# Patient Record
Sex: Male | Born: 1970 | Hispanic: No | Marital: Married | Smoking: Never smoker
Health system: Southern US, Community
[De-identification: ages and names within clinical notes are randomized; demographics above are authoritative.]

## PROBLEM LIST (undated history)

## (undated) DIAGNOSIS — K754 Autoimmune hepatitis: Secondary | ICD-10-CM

## (undated) DIAGNOSIS — E559 Vitamin D deficiency, unspecified: Secondary | ICD-10-CM

## (undated) DIAGNOSIS — K750 Abscess of liver: Secondary | ICD-10-CM

## (undated) HISTORY — DX: Vitamin D deficiency, unspecified: E55.9

## (undated) HISTORY — PX: OTHER SURGICAL HISTORY: SHX169

## (undated) HISTORY — PX: PERCUTANEOUS LIVER BIOPSY: SUR136

## (undated) HISTORY — DX: Abscess of liver: K75.0

## (undated) HISTORY — DX: Autoimmune hepatitis: K75.4

---

## 2000-12-06 ENCOUNTER — Encounter (HOSPITAL_BASED_OUTPATIENT_CLINIC_OR_DEPARTMENT_OTHER): Payer: Self-pay | Admitting: General Surgery

## 2000-12-06 HISTORY — PX: HEMORRHOID SURGERY: SHX153

## 2000-12-06 HISTORY — PX: PROCTOSCOPY: SHX2266

## 2000-12-07 ENCOUNTER — Inpatient Hospital Stay (HOSPITAL_COMMUNITY): Admission: RE | Admit: 2000-12-07 | Discharge: 2000-12-09 | Payer: Self-pay | Admitting: General Surgery

## 2000-12-08 ENCOUNTER — Encounter: Payer: Self-pay | Admitting: Urology

## 2000-12-28 ENCOUNTER — Ambulatory Visit (HOSPITAL_COMMUNITY): Admission: RE | Admit: 2000-12-28 | Discharge: 2000-12-28 | Payer: Self-pay | Admitting: Family Medicine

## 2000-12-28 ENCOUNTER — Encounter: Payer: Self-pay | Admitting: Family Medicine

## 2004-10-06 ENCOUNTER — Emergency Department (HOSPITAL_COMMUNITY): Admission: EM | Admit: 2004-10-06 | Discharge: 2004-10-07 | Payer: Self-pay | Admitting: Emergency Medicine

## 2004-10-14 ENCOUNTER — Emergency Department (HOSPITAL_COMMUNITY): Admission: EM | Admit: 2004-10-14 | Discharge: 2004-10-14 | Payer: Self-pay | Admitting: *Deleted

## 2010-12-08 ENCOUNTER — Emergency Department (INDEPENDENT_AMBULATORY_CARE_PROVIDER_SITE_OTHER): Payer: Worker's Compensation

## 2010-12-08 ENCOUNTER — Emergency Department (HOSPITAL_BASED_OUTPATIENT_CLINIC_OR_DEPARTMENT_OTHER)
Admission: EM | Admit: 2010-12-08 | Discharge: 2010-12-08 | Disposition: A | Discharge status: Home / Self Care | Payer: Worker's Compensation | Attending: Emergency Medicine | Admitting: Emergency Medicine

## 2010-12-08 DIAGNOSIS — R51 Headache: Secondary | ICD-10-CM

## 2010-12-08 DIAGNOSIS — M25569 Pain in unspecified knee: Secondary | ICD-10-CM | POA: Insufficient documentation

## 2010-12-08 DIAGNOSIS — W19XXXA Unspecified fall, initial encounter: Secondary | ICD-10-CM

## 2010-12-08 DIAGNOSIS — Y9289 Other specified places as the place of occurrence of the external cause: Secondary | ICD-10-CM | POA: Insufficient documentation

## 2010-12-08 DIAGNOSIS — E0789 Other specified disorders of thyroid: Secondary | ICD-10-CM

## 2010-12-08 DIAGNOSIS — S0990XA Unspecified injury of head, initial encounter: Secondary | ICD-10-CM

## 2010-12-08 DIAGNOSIS — R404 Transient alteration of awareness: Secondary | ICD-10-CM

## 2010-12-08 DIAGNOSIS — S8990XA Unspecified injury of unspecified lower leg, initial encounter: Secondary | ICD-10-CM

## 2010-12-08 DIAGNOSIS — S060XAA Concussion with loss of consciousness status unknown, initial encounter: Secondary | ICD-10-CM | POA: Insufficient documentation

## 2010-12-08 DIAGNOSIS — S060X9A Concussion with loss of consciousness of unspecified duration, initial encounter: Secondary | ICD-10-CM

## 2010-12-08 MED ORDER — IBUPROFEN 400 MG PO TABS: 400.0000 mg | ORAL_TABLET | Freq: Four times a day (QID) | ORAL | Status: AC | PRN

## 2010-12-08 NOTE — ED Notes (Signed)
Pt speaks very little [place] and interpreter phones were used to get all information from patient; Dr Ignacia Palma in room at time of arrival

## 2010-12-08 NOTE — ED Notes (Signed)
Patient is resting comfortably. 

## 2010-12-08 NOTE — ED Notes (Signed)
c-spine films negative; c-collar removed per order Dr Ignacia Palma; po fluids to patient; comfortable;

## 2010-12-08 NOTE — ED Notes (Signed)
Fell at [employer] per co-worker

## 2010-12-09 ENCOUNTER — Encounter (HOSPITAL_BASED_OUTPATIENT_CLINIC_OR_DEPARTMENT_OTHER): Payer: Self-pay | Admitting: Emergency Medicine

## 2010-12-09 NOTE — ED Provider Notes (Signed)
History     CSN: [id number] Arrival date & time: 12/08/2010  3:14 PM   First MD Initiated Contact with Patient 12/08/10 1536      Chief Complaint  Patient presents with  . Fall  . Loss of Consciousness    (Consider location/radiation/quality/duration/timing/severity/associated sxs/prior treatment) HPI Comments: Pt's coworker says that pt fell at work, hitting his head.  He was unconscious for three minutes.  There were no convulsive movements, and no incontinence.  He complains of pain in the left jaw and left knee.  A cervical collar was applied in Triage.  Review of systems could not be obtained due to the language barrier.  Patient is a 40 y.o. male presenting with head injury. The history is provided by the patient and a friend. The history is limited by a language barrier. A language interpreter was used Hospital doctor).  Head Injury  The incident occurred less than 1 hour ago. He came to the ER via walk-in. The injury mechanism was a fall. He lost consciousness for a period of 1 to 5 minutes. There was no blood loss. The quality of the pain is described as dull. The patient is experiencing no pain. He has tried nothing for the symptoms.    Past Medical History  Diagnosis Date  . Rectal pain     History reviewed. No pertinent past surgical history.  No family history on file.  History  Substance Use Topics  . Smoking status: Never Smoker   . Smokeless tobacco: Not on file  . Alcohol Use: 1.2 oz/week    2 Cans of beer per week     once a week      Review of Systems  Unable to perform ROS: Other    Allergies  Review of patient's allergies indicates no known allergies.  Home Medications   Current Outpatient Rx  Name Route Sig Dispense Refill  . IBUPROFEN 400 MG PO TABS Oral Take 1 tablet (400 mg total) by mouth every 6 (six) hours as needed for pain. 20 tablet 0    BP 126/87  Pulse 71  Temp(Src) 98.7 F (37.1 C) (Oral)  Resp 18  SpO2  100%  Physical Exam  Constitutional: He is oriented to person, place, and time. He appears well-developed and well-nourished.       In mild distress, wearing cervical collar, complaining of pain in the left jaw and left knee.  HENT:  Head: Normocephalic and atraumatic.  Right Ear: External ear normal.  Left Ear: External ear normal.  Mouth/Throat: Oropharynx is clear and moist.  Eyes: Conjunctivae and EOM are normal. Pupils are equal, round, and reactive to light.  Neck: Normal range of motion. Neck supple.       C-collar left on until CT of C-spine completed.  Cardiovascular: Normal rate, regular rhythm and normal heart sounds.   Pulmonary/Chest: Effort normal and breath sounds normal.  Abdominal: Bowel sounds are normal. He exhibits no distension. There is no tenderness.  Musculoskeletal:       Left knee has mild tenderness over the patella.  There is no bony deformity and no ligamentous instability.  There is no effusion.  He has intact pulses, sensation and tendon function in the left foot.   Neurological: He is alert and oriented to person, place, and time.       No sensory of motor deficits.  Skin: Skin is warm and dry.  Psychiatric: He has a normal mood and affect. His behavior is normal.  ED Course  Procedures (including critical care time)  Labs Reviewed - No data to display Ct Head Wo Contrast  12/08/2010  *RADIOLOGY REPORT*  Clinical Data:  Fall.  Loss of consciousness.  Head injury.  Facial pain.  CT HEAD WITHOUT CONTRAST CT MAXILLOFACIAL WITHOUT CONTRAST CT CERVICAL SPINE WITHOUT CONTRAST  Technique:  Multidetector CT imaging of the head, cervical spine, and maxillofacial structures were performed using the standard protocol without intravenous contrast. Multiplanar CT image reconstructions of the cervical spine and maxillofacial structures were also generated.  Comparison:  None.  CT HEAD  Findings: Calcification of the globus pallidus nuclei is symmetric and likely  physiologic.  The brain stem, cerebellum, cerebral peduncles, thalami, basal ganglia, basilar cisterns, and ventricular system appear unremarkable.  No intracranial hemorrhage, mass lesion, or acute infarction is identified.  IMPRESSION:  1.  No significant abnormality identified.  CT MAXILLOFACIAL  Findings:  Dental implants noted.  The maxillary incisors and the canine superior anteriorly displaced with respect to the palate, with mesh in place, this probably represents expected appearance of correction but correlate with physical exam of the teeth.  There appears to be a small cavity of the medial right mandibular molar on image 58 of series 4.  A supernumerary tooth is present inferiorly in the right mandible as shown on images 72-73 of series 4.  No facial fracture is observed.  The upper portion of the mandibular body has a thin anterior - posterior dimension as shown on image 67 of series 4.  The orbits appear unremarkable.  IMPRESSION:  1.  No facial fracture is observed. 2.  Dental prosthesis noted, with unusually thin upper mandibular body and with a supernumerary tooth in the lower portion of the right mandible.  The maxillary incisors and can lines are anteriorly located with respect to the palate, but this is likely due to a bridge - correlate with inspection of the oral cavity. 3.  A small cavity of the right medial mandibular molar adjacent to the premolar.  CT CERVICAL SPINE  Findings:   No prevertebral soft tissue swelling is identified.  No cervical vertebral malalignment noted.  No cervical spine fracture is evident.  A 0.9 x 0.7 cm lucent lesion with a thin sclerotic rim is noted in the pars region of the left C2 vertebra.  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  IMPRESSION:  1.  No acute cervical spine findings. 2.  Lucent lesion in the pars region of the left C2 vertebra has a  thin sclerotic margin indicating a longstanding process, most compatible with a benign bony lesion such as a bone cyst.  Given the small size, osteoblastoma is considered unlikely.  A small chondroid lesion could appear similarly. 3.  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  Original Report Authenticated By: Dellia Cloud, M.D.   Ct Cervical Spine Wo Contrast  12/08/2010  *RADIOLOGY REPORT*  Clinical Data:  Fall.  Loss of consciousness.  Head injury.  Facial pain.  CT HEAD WITHOUT CONTRAST CT MAXILLOFACIAL WITHOUT CONTRAST CT CERVICAL SPINE WITHOUT CONTRAST  Technique:  Multidetector CT imaging of the head, cervical spine, and maxillofacial structures were performed using the standard protocol without intravenous contrast. Multiplanar CT image reconstructions of the cervical spine and maxillofacial structures were also generated.  Comparison:  None.  CT HEAD  Findings: Calcification of the globus pallidus nuclei is symmetric and likely physiologic.  The brain stem, cerebellum, cerebral peduncles, thalami, basal ganglia, basilar cisterns, and ventricular system appear unremarkable.  No intracranial hemorrhage, mass lesion, or acute infarction is identified.  IMPRESSION:  1.  No significant abnormality identified.  CT MAXILLOFACIAL  Findings:  Dental implants noted.  The maxillary incisors and the canine superior anteriorly displaced with respect to the palate, with mesh in place, this probably represents expected appearance of correction but correlate with physical exam of the teeth.  There appears to be a small cavity of the medial right mandibular molar on image 58 of series 4.  A supernumerary tooth is present inferiorly in the right mandible as shown on images 72-73 of series 4.  No facial fracture is observed.  The upper portion of the mandibular body has a thin anterior - posterior dimension as shown  on image 67 of series 4.  The orbits appear unremarkable.  IMPRESSION:  1.  No facial fracture is observed. 2.  Dental prosthesis noted, with unusually thin upper mandibular body and with a supernumerary tooth in the lower portion of the right mandible.  The maxillary incisors and can lines are anteriorly located with respect to the palate, but this is likely due to a bridge - correlate with inspection of the oral cavity. 3.  A small cavity of the right medial mandibular molar adjacent to the premolar.  CT CERVICAL SPINE  Findings:   No prevertebral soft tissue swelling is identified.  No cervical vertebral malalignment noted.  No cervical spine fracture is evident.  A 0.9 x 0.7 cm lucent lesion with a thin sclerotic rim is noted in the pars region of the left C2 vertebra.  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  IMPRESSION:  1.  No acute cervical spine findings. 2.  Lucent lesion in the pars region of the left C2 vertebra has a thin sclerotic margin indicating a longstanding process, most compatible with a benign bony lesion such as a bone cyst.  Given the small size, osteoblastoma is considered unlikely.  A small chondroid lesion could appear similarly. 3.  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  Original Report Authenticated By: Dellia Cloud, M.D.   Dg Knee Complete 4 Views Left  12/08/2010  *RADIOLOGY REPORT*  Clinical Data: Larey Seat.  Injured left knee.  LEFT KNEE - COMPLETE 4+ VIEW  Comparison: None  Findings: The joint spaces are maintained.  No acute bony findings or joint effusion.  IMPRESSION: No acute bony findings.  Original Report Authenticated By: P. Loralie Champagne, M.D.   Ct Maxillofacial Wo Cm  12/08/2010  *RADIOLOGY REPORT*  Clinical Data:  Fall.  Loss of consciousness.   Head injury.  Facial pain.  CT HEAD WITHOUT CONTRAST CT MAXILLOFACIAL WITHOUT CONTRAST CT CERVICAL SPINE WITHOUT CONTRAST  Technique:  Multidetector CT imaging of the head, cervical spine, and maxillofacial structures were performed using the standard protocol without intravenous contrast. Multiplanar CT image reconstructions of the cervical spine and maxillofacial structures were also generated.  Comparison:  None.  CT HEAD  Findings: Calcification of the globus pallidus nuclei is symmetric and likely physiologic.  The brain stem, cerebellum, cerebral peduncles, thalami, basal ganglia, basilar cisterns, and ventricular system appear unremarkable.  No intracranial hemorrhage, mass lesion, or acute infarction  is identified.  IMPRESSION:  1.  No significant abnormality identified.  CT MAXILLOFACIAL  Findings:  Dental implants noted.  The maxillary incisors and the canine superior anteriorly displaced with respect to the palate, with mesh in place, this probably represents expected appearance of correction but correlate with physical exam of the teeth.  There appears to be a small cavity of the medial right mandibular molar on image 58 of series 4.  A supernumerary tooth is present inferiorly in the right mandible as shown on images 72-73 of series 4.  No facial fracture is observed.  The upper portion of the mandibular body has a thin anterior - posterior dimension as shown on image 67 of series 4.  The orbits appear unremarkable.  IMPRESSION:  1.  No facial fracture is observed. 2.  Dental prosthesis noted, with unusually thin upper mandibular body and with a supernumerary tooth in the lower portion of the right mandible.  The maxillary incisors and can lines are anteriorly located with respect to the palate, but this is likely due to a bridge - correlate with inspection of the oral cavity. 3.  A small cavity of the right medial mandibular molar adjacent to the premolar.  CT CERVICAL SPINE  Findings:   No  prevertebral soft tissue swelling is identified.  No cervical vertebral malalignment noted.  No cervical spine fracture is evident.  A 0.9 x 0.7 cm lucent lesion with a thin sclerotic rim is noted in the pars region of the left C2 vertebra.  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  IMPRESSION:  1.  No acute cervical spine findings. 2.  Lucent lesion in the pars region of the left C2 vertebra has a thin sclerotic margin indicating a longstanding process, most compatible with a benign bony lesion such as a bone cyst.  Given the small size, osteoblastoma is considered unlikely.  A small chondroid lesion could appear similarly. 3.  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  Original Report Authenticated By: Dellia Cloud, M.D.   Exam and X-rays were negative.  Pt reassured.  Rx Ibuprofen if needed for pain.  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  1. Cerebral concussion            Carleene Cooper III, MD 12/09/10 [phone]

## 2011-02-13 ENCOUNTER — Ambulatory Visit (INDEPENDENT_AMBULATORY_CARE_PROVIDER_SITE_OTHER): Payer: 59

## 2011-02-13 DIAGNOSIS — J111 Influenza due to unidentified influenza virus with other respiratory manifestations: Secondary | ICD-10-CM

## 2011-02-13 DIAGNOSIS — J189 Pneumonia, unspecified organism: Secondary | ICD-10-CM

## 2011-08-14 ENCOUNTER — Emergency Department (HOSPITAL_COMMUNITY)
Admission: EM | Admit: 2011-08-14 | Discharge: 2011-08-14 | Disposition: A | Discharge status: Home / Self Care | Payer: 59 | Attending: Emergency Medicine | Admitting: Emergency Medicine

## 2011-08-14 ENCOUNTER — Encounter (HOSPITAL_COMMUNITY): Payer: Self-pay | Admitting: Emergency Medicine

## 2011-08-14 DIAGNOSIS — B349 Viral infection, unspecified: Secondary | ICD-10-CM

## 2011-08-14 DIAGNOSIS — J029 Acute pharyngitis, unspecified: Secondary | ICD-10-CM | POA: Insufficient documentation

## 2011-08-14 DIAGNOSIS — B9789 Other viral agents as the cause of diseases classified elsewhere: Secondary | ICD-10-CM | POA: Insufficient documentation

## 2011-08-14 DIAGNOSIS — J310 Chronic rhinitis: Secondary | ICD-10-CM | POA: Insufficient documentation

## 2011-08-14 DIAGNOSIS — R300 Dysuria: Secondary | ICD-10-CM | POA: Insufficient documentation

## 2011-08-14 LAB — URINALYSIS, ROUTINE W REFLEX MICROSCOPIC
Leukocytes, UA: NEGATIVE
Nitrite: NEGATIVE
Specific Gravity, Urine: 1.015 (ref 1.005–1.030)
Urobilinogen, UA: 1 mg/dL (ref 0.0–1.0)
pH: 8 (ref 5.0–8.0)

## 2011-08-14 LAB — RAPID STREP SCREEN (MED CTR MEBANE ONLY): Streptococcus, Group A Screen (Direct): NEGATIVE

## 2011-08-14 NOTE — ED Provider Notes (Signed)
History     CSN: [id number]  Arrival date & time 08/14/11  2036   First MD Initiated Contact with Patient 08/14/11 2223      Chief Complaint  Patient presents with  . Sore Throat  . Urinary Urgency    (Consider location/radiation/quality/duration/timing/severity/associated sxs/prior treatment) HPI Comments: Patient with 5 days of low grade fever, rhinitis, sore throat and dysuria Has been takingOTC Advil with transient relief   Patient is a 41 y.o. male presenting with pharyngitis. The history is provided by the patient. A language interpreter was used.  Sore Throat The current episode started in the past 7 days. The problem has been unchanged. Associated symptoms include congestion, a fever, a sore throat and urinary symptoms. Pertinent negatives include no chills, headaches, vomiting or weakness.    Past Medical History  Diagnosis Date  . Rectal pain     Past Surgical History  Procedure Date  . Colonoscopy     History reviewed. No pertinent family history.  History  Substance Use Topics  . Smoking status: Never Smoker   . Smokeless tobacco: Not on file  . Alcohol Use: 1.2 oz/week    2 Cans of beer per week     once a week      Review of Systems  Constitutional: Positive for fever. Negative for chills.  HENT: Positive for congestion, sore throat, rhinorrhea and postnasal drip. Negative for trouble swallowing, voice change and sinus pressure.   Eyes: Negative for visual disturbance.  Gastrointestinal: Negative for vomiting.  Genitourinary: Positive for frequency.  Neurological: Negative for dizziness, weakness and headaches.    Allergies  Review of patient's allergies indicates no known allergies.  Home Medications   Current Outpatient Rx  Name Route Sig Dispense Refill  . IBUPROFEN 200 MG PO TABS Oral Take 200 mg by mouth every 6 (six) hours as needed. For pain      BP 123/96  Pulse 69  Temp 98.9 F (37.2 C) (Oral)  Resp 22  SpO2 100%  Physical  Exam  Constitutional: He appears well-developed.  HENT:  Head: Normocephalic.  Eyes: Pupils are equal, round, and reactive to light.  Neck: Normal range of motion.  Cardiovascular: Normal rate.   Pulmonary/Chest: Effort normal. No respiratory distress. He has no wheezes.  Abdominal: Soft. He exhibits no distension.  Genitourinary: Penis normal.  Musculoskeletal: Normal range of motion.  Neurological: He is alert.  Skin: Skin is warm.    ED Course  Procedures (including critical care time)  Labs Reviewed  URINALYSIS, ROUTINE W REFLEX MICROSCOPIC - Abnormal; Notable for the following:    Color, Urine AMBER (*)  BIOCHEMICALS MAY BE AFFECTED BY COLOR   APPearance CLOUDY (*)     Bilirubin Urine SMALL (*)     All other components within normal limits  RAPID STREP SCREEN   No results found.   1. Viral syndrome       MDM   Strep test negative exam is non contributory  UA is normal         Arman Filter, NP 08/14/11 2228  Arman Filter, NP 08/14/11 2228  Arman Filter, NP 08/14/11 2229

## 2011-08-14 NOTE — ED Provider Notes (Signed)
Medical screening examination/treatment/procedure(s) were performed by non-physician practitioner and as supervising physician I was immediately available for consultation/collaboration.   Charles B. Sheldon, MD 08/14/11 2333 

## 2011-08-14 NOTE — ED Notes (Addendum)
Pt speaks [place]- interpreter present at triage; Pt reports fever, cold, sore throat, reports SOB, for 5 days; also reports difficulty urine, but no pain, has been able to void today, but little amounts; also states having nausea; on inspection, pt does have swelling to back of throat

## 2011-08-17 ENCOUNTER — Ambulatory Visit
Admission: RE | Admit: 2011-08-17 | Discharge: 2011-08-17 | Disposition: A | Discharge status: Home / Self Care | Payer: 59 | Source: Ambulatory Visit | Attending: Family Medicine | Admitting: Family Medicine

## 2011-08-17 ENCOUNTER — Ambulatory Visit (INDEPENDENT_AMBULATORY_CARE_PROVIDER_SITE_OTHER): Payer: 59 | Admitting: Family Medicine

## 2011-08-17 VITALS — BP 117/76 | HR 69 | Temp 98.0°F | Resp 16 | Ht 68.25 in | Wt 160.2 lb

## 2011-08-17 DIAGNOSIS — R109 Unspecified abdominal pain: Secondary | ICD-10-CM

## 2011-08-17 DIAGNOSIS — R17 Unspecified jaundice: Secondary | ICD-10-CM

## 2011-08-17 DIAGNOSIS — R630 Anorexia: Secondary | ICD-10-CM

## 2011-08-17 LAB — COMPREHENSIVE METABOLIC PANEL
BUN: 12 mg/dL (ref 6–23)
CO2: 28 mEq/L (ref 19–32)
Creat: 0.72 mg/dL (ref 0.50–1.35)
Glucose, Bld: 92 mg/dL (ref 70–99)
Sodium: 136 mEq/L (ref 135–145)
Total Bilirubin: 10.1 mg/dL — ABNORMAL HIGH (ref 0.3–1.2)
Total Protein: 8.2 g/dL (ref 6.0–8.3)

## 2011-08-17 LAB — POCT CBC
HCT, POC: 42.3 % — AB (ref 43.5–53.7)
Hemoglobin: 13.6 g/dL — AB (ref 14.1–18.1)
Lymph, poc: 2.4 (ref 0.6–3.4)
MCH, POC: 29.6 pg (ref 27–31.2)
MCHC: 32.2 g/dL (ref 31.8–35.4)
MCV: 91.9 fL (ref 80–97)
WBC: 5.5 10*3/uL (ref 4.6–10.2)

## 2011-08-17 LAB — LIPASE: Lipase: 28 U/L (ref 0–75)

## 2011-08-17 LAB — PROTIME-INR: INR: 1.16 (ref <1.50)

## 2011-08-17 LAB — IFOBT (OCCULT BLOOD): IFOBT: NEGATIVE

## 2011-08-17 LAB — AMYLASE: Amylase: 60 U/L (ref 0–105)

## 2011-08-17 NOTE — Patient Instructions (Addendum)
I am concerned that you are having a problem with your liver or galbaldder.  We are going to have an ultrasound done today at 11:00 am. Do not eat or drink anything until after your ultrasound.   Your ultrasound will be done at [facility].   I have also ordered labs today which will be back in a few hours.   I will call you when your labs and ultrasound results are in- will be later today

## 2011-08-17 NOTE — Progress Notes (Signed)
Date:  08/17/2011   Name:  Michael West   DOB:  1970/10/16   MRN:  [id number]  PCP:  No primary provider on file.    Chief Complaint: Abdominal Pain, Fatigue and Nausea   History of Present Illness:  Michael West is a 41 y.o. very pleasant male patient who presents with the following:  Has noted abdominal discomfort for the last week or so- getting worse.  He cannot eat much due to the pain- has increased pain after eating.  He has felt nauseated but has not thrown up.  He has been  only having a BM every few days; however this is normal for him. No diarrhea or loose stools.  He has noted dark stools.  He had a temperature a few days ago- less than 101.   He is generally healthy as far as he knows.  He was actually at the ED a few days ago, but it looks like he was evaluated for a ST and dysuria at that time and sent home.  He is from [place] and has lived in the [place] for several years.    He does not smoke, and only occasionally drinks beer.  He might drink a beer every few weeks.  Lives with his wife.  He drank some milk early this morning, but has not been able to eat much.   He is accompanied today by his friend Huy who also serves as his interpreter- Michael West does not speak [place].    There is no problem list on file for this patient.  Past Medical History  Diagnosis Date  . Rectal pain    Past Surgical History  Procedure Date  . Colonoscopy    History  Substance Use Topics  . Smoking status: Never Smoker   . Smokeless tobacco: Not on file  . Alcohol Use: 1.2 oz/week    2 Cans of beer per week     once a week   No family history on file. No Known Allergies  Medication list has been reviewed and updated.  Current Outpatient Prescriptions on File Prior to Visit  Medication Sig Dispense Refill  . ibuprofen (ADVIL,MOTRIN) 200 MG tablet Take 200 mg by mouth every 6 (six) hours as needed. For pain        Review of Systems:  As per HPI- otherwise  negative.   Physical Examination: Filed Vitals:   08/17/11 0833  BP: 117/76  Pulse: 69  Temp: 98 F (36.7 C)  Resp: 16   Filed Vitals:   08/17/11 0833  Height: 5' 8.25" (1.734 m)  Weight: 160 lb 3.2 oz (72.666 kg)   Body mass index is 24.18 kg/(m^2).Body mass index is 24.18 kg/(m^2). Ideal Body Weight: Weight in (lb) to have BMI = 25: 165.3   GEN: WDWN, NAD, Non-toxic, A & O x 3, appears jaundiced but not acutely ill HEENT: Atraumatic, Normocephalic. Neck supple. No masses, No LAD.  Scleral icterus, PEERL, EOMI, TM and oropharynx wnl Ears and Nose: No external deformity. CV: RRR, No M/G/R. No JVD. No thrill. No extra heart sounds. PULM: CTA B, no wheezes, crackles, rhonchi. No retractions. No resp. distress. No accessory muscle use. ABD: S, ND, normal BS. No rebound.  Epigastric and RUQ tenderness (mild), positive Murphy's sign.   Rectal exam: no stool in vault, no gross blood or melena EXTR: No c/c/e NEURO Normal gait.  PSYCH: Normally interactive. Conversant. Not depressed or anxious appearing.  Calm demeanor.   Results for orders placed  in visit on 08/17/11  POCT CBC      Component Value Range   WBC 5.5  4.6 - 10.2 K/uL   Lymph, poc 2.4  0.6 - 3.4   POC LYMPH PERCENT 43.2  10 - 50 %L   MID (cbc) 0.5  0 - 0.9   POC MID % 9.5  0 - 12 %M   POC Granulocyte 2.6  2 - 6.9   Granulocyte percent 47.3  37 - 80 %G   RBC 4.60 (*) 4.69 - 6.13 M/uL   Hemoglobin 13.6 (*) 14.1 - 18.1 g/dL   HCT, POC 40.9 (*) 81.1 - 53.7 %   MCV 91.9  80 - 97 fL   MCH, POC 29.6  27 - 31.2 pg   MCHC 32.2  31.8 - 35.4 g/dL   RDW, POC 91.4     Platelet Count, POC 199  142 - 424 K/uL   MPV 9.5  0 - 99.8 fL  IFOBT (OCCULT BLOOD)      Component Value Range   IFOBT Negative      Assessment and Plan: 1. Abdominal  pain, other specified site  POCT CBC, Comprehensive metabolic panel, Amylase, Lipase, IFOBT POC (occult bld, rslt in office), US Abdomen Complete, Protime-INR  2. Anorexia  US Abdomen  Complete  3. Jaundice  US Abdomen Complete   Concerned about acute liver irritation- ?obstructive bile duct stone. However, urgent abdominal ultrasound virtually normal. Due to communication error his stat CMP was not done stat, and I did not receive his results until about 6pm.  Discussed very elevated LFTs and bilirubin with Dr. Arlyce Dice.  He recommended that we check a PT/ INR- if abnormal he will need admission, but if normal he can be seen tomorrow in GI clinic.  Elizeo returned to have a stat PT/ INR.  Results normal (PT minimally elevated at 15.3).  Called and let Huy know- the plan is to have Cadyn see [facility] tomorrow.  I will call GI first thing in the morning.  [phone]  Huy, friend.  There is no [place] speaker in [place] home.  Abbe Amsterdam, MD

## 2011-08-18 ENCOUNTER — Telehealth: Payer: Self-pay

## 2011-08-18 NOTE — Telephone Encounter (Signed)
Received call from Dr. Warner Mccreedy regarding pt. Dr. Arlyce Dice spoke with her yesterday and agreed to see this pt for acute hepatitis. Pt scheduled to see Mike Gip PA tomorrow at 1:30pm. Spoke with [facility] the pts friend to let him know about the appt date and time.

## 2011-08-19 ENCOUNTER — Ambulatory Visit (INDEPENDENT_AMBULATORY_CARE_PROVIDER_SITE_OTHER): Payer: 59 | Admitting: Physician Assistant

## 2011-08-19 ENCOUNTER — Encounter: Payer: Self-pay | Admitting: Physician Assistant

## 2011-08-19 ENCOUNTER — Other Ambulatory Visit (INDEPENDENT_AMBULATORY_CARE_PROVIDER_SITE_OTHER): Payer: 59

## 2011-08-19 VITALS — BP 90/64 | HR 64 | Temp 98.4°F | Ht 67.0 in | Wt 159.8 lb

## 2011-08-19 DIAGNOSIS — K7200 Acute and subacute hepatic failure without coma: Secondary | ICD-10-CM

## 2011-08-19 DIAGNOSIS — B179 Acute viral hepatitis, unspecified: Secondary | ICD-10-CM

## 2011-08-19 LAB — COMPREHENSIVE METABOLIC PANEL
ALT: 1344 U/L — ABNORMAL HIGH (ref 0–53)
CO2: 28 mEq/L (ref 19–32)
Calcium: 9.1 mg/dL (ref 8.4–10.5)
Chloride: 99 mEq/L (ref 96–112)
Creatinine, Ser: 0.5 mg/dL (ref 0.4–1.5)
GFR: 202.13 mL/min (ref >60.00)
Total Protein: 9.2 g/dL — ABNORMAL HIGH (ref 6.0–8.3)

## 2011-08-19 LAB — CBC WITH DIFFERENTIAL/PLATELET
Basophils Absolute: 0 10*3/uL (ref 0.0–0.1)
Eosinophils Absolute: 0.2 10*3/uL (ref 0.0–0.7)
MCHC: 33.5 g/dL (ref 30.0–36.0)
MCV: 90.6 fl (ref 78.0–100.0)
Monocytes Absolute: 0.7 10*3/uL (ref 0.1–1.0)
Neutrophils Relative %: 52.4 % (ref 43.0–77.0)
Platelets: 176 10*3/uL (ref 150.0–400.0)
WBC: 5.3 10*3/uL (ref 4.5–10.5)

## 2011-08-19 NOTE — Patient Instructions (Addendum)
Please go to the basement level to have your labs drawn.  We will call the interpreter when we have the results of the labwork and determine when you can go back to work.

## 2011-08-21 ENCOUNTER — Encounter: Payer: Self-pay | Admitting: Physician Assistant

## 2011-08-21 NOTE — Progress Notes (Signed)
Reviewed and agree. I am sure he was told not to drink any alcohol. Further assessment pending Hep serologies.

## 2011-08-21 NOTE — Progress Notes (Signed)
Subjective:    Patient ID: Michael West, male    DOB: [birth year], 41 y.o.   MRN: [id number]  HPI Michael West is a 41 year old [place] male referred today per Dr. Warner Mccreedy for evaluation of new onset of jaundice and abdominal pain. Patient does not speak [place], he is accompanied by a friend who speaks some [place] and an interpreter. He denies any prior history of known liver disease or jaundice. He says he thinks he had a hepatitis B vaccine when he came to the [place] but is not certain. He said he has onset of his current illness around July 4 when he noted lower chest and epigastric pain fever and decrease in appetite. He says he felt bad for 3 or 4 days and since then has been feeling a bit better. He says he is no longer having any fever, he continues to have epigastric discomfort radiating into his back which she says is constant. He is also had some nausea but no vomiting His appetite remains poor and he is eating small amounts at a time. He cannot tell me when he first noticed that he was jaundiced or when his urine became dark. He denies any itching. He has not had any associated rash diarrhea headache. Is not on any regular medications or any supplements. He says he does not drink alcohol. He is unaware of any family history of liver disease.  Upper abdominal ultrasound done on 08/17/2011 shows a thickened gallbladder wall which may be due to hypoproteinemia gallbladder wall measured 7.6 mm no gallstones no intrahepatic ductal dilation common bile duct normal at 2.7 mm liver has a normal echogenic pattern tail of the pancreas is obscured, remainder of exam unremarkable . Labs on 08/17/2011 protime 15.3 INR of 1.16 WBC of 5.5 hemoglobin 13.6 hematocrit 42.3 MCV of 91 platelets 199 electrolytes within normal limits total bilirubin 10.1 alkaline phosphatase 138 AST 1533 ALT of 1329 amylase was 60 lipase is 28. No hepatitis serologies have been sent    Review of Systems  Constitutional: Positive  for fever, appetite change and fatigue.  HENT: Negative.   Eyes: Negative.   Respiratory: Negative.   Cardiovascular: Negative.   Gastrointestinal: Positive for nausea and abdominal pain.  Genitourinary: Negative.   Skin: Positive for color change.  Neurological: Negative.   Hematological: Negative.   Psychiatric/Behavioral: Negative.    Outpatient Encounter Prescriptions as of 08/19/2011  Medication Sig Dispense Refill  . ibuprofen (ADVIL,MOTRIN) 200 MG tablet Take 200 mg by mouth every 6 (six) hours as needed. For pain       No Known Allergies There is no problem list on file for this patient.     History   Social History  . Marital Status: Married    Spouse Name: N/A    Number of Children: N/A  . Years of Education: N/A   Occupational History  . Not on file.   Social History Main Topics  . Smoking status: Never Smoker   . Smokeless tobacco: Never Used  . Alcohol Use: 1.2 oz/week    2 Cans of beer per week     once a week  . Drug Use: No  . Sexually Active: Not Currently   Other Topics Concern  . Not on file   Social History Narrative  . No narrative on file   Objective:   Physical Exam well-developed [place] male who appears younger than his stated age, pleasant accompanied by a friend and an interpreter. He is jaundiced. Blood pressure  90/64 pulse 64 temp 98 4 height 5 foot 7 weight 159. HEENT nontraumatic normocephalic EOMI PERRLA sclera icteric. Neck supple no JVD, Cardiovascular regular rate and rhythm with S1-S2 no murmur or gallop, Pulmonary clear bilaterally, Abdomen soft he is tender in the epigastrium and mildly in the right upper quadrant there is no palpable hepatomegaly no palpable splenomegaly, bowel sounds are active no guarding or rebound. Rectal not done, Extremities no clubbing cyanosis or edema skin warm and dry. Skin is jaundiced., Psych; mood and affect appropriate.        Assessment & Plan:  #67 41 year old [place] male who does not  speak any English who presents with onset of symptoms about one week ago with upper abdominal pain decrease in appetite and fever. Workup to date positive for gallbladder wall thickening but no evidence of gallstones or ductal dilation and markedly elevated LFTs more consistent with an acute hepatitis than biliary disease. We have no old labs to compare and therefore cannot say that he had normal LFTs prior to this acute illness. Will need to rule out acute viral hepatitis; hepatitis A,B,C.. Other possibilities are an EBV hepatitis or CMV hepatitis. Michael West could have an underlying autoimmune disease.  Plan; discussion with the patient through the interpreter regarding probable acute hepatitis. He is given a note to remain out of work until we receive the viral serology back, he is advised to stay home and rest, avoid all over-the-counter and prescription medications as well as alcohol.  Will repeat hepatic panel CBC with differential and pro time to assess synthetic function Acute hepatitis panel, and ANA. We'll plan to see the patient back in the office in 2-3 days to reassess and repeat labs. He was told that should he feel  worse at any point to proceed to the emergency room.

## 2011-08-22 ENCOUNTER — Telehealth: Payer: Self-pay | Admitting: *Deleted

## 2011-08-22 ENCOUNTER — Other Ambulatory Visit: Payer: Self-pay

## 2011-08-22 LAB — HEPATITIS PANEL, ACUTE: HCV Ab: NEGATIVE

## 2011-08-22 NOTE — Telephone Encounter (Signed)
I called the translator that was here on Friday 08-19-2011 .  I advised him we changed the time of the appointment to 2 PM on Thurs 08-25-2011.  He said he would let the patient Michael West know.

## 2011-08-23 LAB — ANTI-NUCLEAR AB-TITER (ANA TITER): ANA Titer 1: NEGATIVE

## 2011-08-23 LAB — ANA: Anti Nuclear Antibody(ANA): POSITIVE — AB

## 2011-08-24 ENCOUNTER — Telehealth: Payer: Self-pay | Admitting: *Deleted

## 2011-08-24 ENCOUNTER — Other Ambulatory Visit (INDEPENDENT_AMBULATORY_CARE_PROVIDER_SITE_OTHER): Payer: 59

## 2011-08-24 LAB — HEPATIC FUNCTION PANEL
AST: 1803 U/L — ABNORMAL HIGH (ref 0–37)
Albumin: 3.7 g/dL (ref 3.5–5.2)
Alkaline Phosphatase: 133 U/L — ABNORMAL HIGH (ref 39–117)
Total Protein: 9.3 g/dL — ABNORMAL HIGH (ref 6.0–8.3)

## 2011-08-24 NOTE — Telephone Encounter (Signed)
Spoke to [employer] , [employer], cell # [phone].  He told me to call [phone] and let them know about the appt tomorrow 08-25-2011 for Michael West.  I called that number and they told me to call [phone] and I requested Michael West to be here for the pt's appt tomorrow 08-25-2011. They did have this appt on the books.

## 2011-08-25 ENCOUNTER — Ambulatory Visit (INDEPENDENT_AMBULATORY_CARE_PROVIDER_SITE_OTHER): Payer: 59 | Admitting: Physician Assistant

## 2011-08-25 ENCOUNTER — Encounter: Payer: Self-pay | Admitting: Physician Assistant

## 2011-08-25 ENCOUNTER — Other Ambulatory Visit (INDEPENDENT_AMBULATORY_CARE_PROVIDER_SITE_OTHER): Payer: 59

## 2011-08-25 VITALS — BP 86/60 | HR 80 | Ht 67.0 in | Wt 162.0 lb

## 2011-08-25 DIAGNOSIS — K759 Inflammatory liver disease, unspecified: Secondary | ICD-10-CM

## 2011-08-25 DIAGNOSIS — B179 Acute viral hepatitis, unspecified: Secondary | ICD-10-CM

## 2011-08-25 DIAGNOSIS — K754 Autoimmune hepatitis: Secondary | ICD-10-CM | POA: Insufficient documentation

## 2011-08-25 DIAGNOSIS — K7200 Acute and subacute hepatic failure without coma: Secondary | ICD-10-CM

## 2011-08-25 LAB — COMPREHENSIVE METABOLIC PANEL
ALT: 1123 U/L — ABNORMAL HIGH (ref 0–53)
Alkaline Phosphatase: 122 U/L — ABNORMAL HIGH (ref 39–117)
CO2: 29 mEq/L (ref 19–32)
Creatinine, Ser: 0.5 mg/dL (ref 0.4–1.5)
GFR: 202.11 mL/min (ref >60.00)
Sodium: 135 mEq/L (ref 135–145)
Total Bilirubin: 14.8 mg/dL — ABNORMAL HIGH (ref 0.3–1.2)
Total Protein: 8.7 g/dL — ABNORMAL HIGH (ref 6.0–8.3)

## 2011-08-25 LAB — CBC WITH DIFFERENTIAL/PLATELET
Basophils Relative: 0.9 % (ref 0.0–3.0)
Eosinophils Absolute: 0.2 10*3/uL (ref 0.0–0.7)
Lymphs Abs: 1.6 10*3/uL (ref 0.7–4.0)
MCHC: 33.9 g/dL (ref 30.0–36.0)
MCV: 89.7 fl (ref 78.0–100.0)
Monocytes Absolute: 0.6 10*3/uL (ref 0.1–1.0)
Neutrophils Relative %: 58.3 % (ref 43.0–77.0)
Platelets: 184 10*3/uL (ref 150.0–400.0)
RBC: 4.16 Mil/uL — ABNORMAL LOW (ref 4.22–5.81)

## 2011-08-25 LAB — MONONUCLEOSIS SCREEN: Mono Screen: NEGATIVE

## 2011-08-25 NOTE — Progress Notes (Signed)
Subjective:    Patient ID: Michael West, male    DOB: [birth year], 41 y.o.   MRN: [id number]  HPI Michael West is a 41 year old [place] male who was initially seen in the office on 08/21/2011 he comes back in today for followup He has an acute hepatitis of unclear etiology, and has been feeling poorly since July 4. At that time he apparently developed epigastric discomfort fever and decrease in his appetite. At the time of his last visit he complained of epigastric discomfort radiating through to his back which he had said was fairly constant.  Upper abdominal ultrasound was done on 08/17/2011 showed a thickened gallbladder wall felt possibly due to hypoproteinemia gallbladder wall of 7.6 mm no gallstones and no ductal dilation liver with normal echogenic pattern, pancreas obscured. Acute hepatitis A, B, and C serologies were sent off and/or negative. ANA came back positive but the titer is negative. EBV and CMV serologies are pending. Labs were repeated yesterday prior to this visit and did not show any improvement. His INR is stable at 1.2. Total bilirubin is 14.9, alkaline phosphatase is 133 SGOT 1803 SGPT 1180.   Conversation is difficult and limited, even with the interpreter there seems to be significant language barrier, and patient's wife appears frustrated and does not understand my answers. She asked on 2 or 3 occasions for a "medicine" to make him better. Patient states that he has felt worse over the past 5-6 days and has been to fatigue to go to work. He says he has no energy and no appetite. He is only eating very small amounts. Apparently no nausea or vomiting and no documented fever. He says he has had mild headache off and on, denies myalgias arthralgias diarrhea etc. He continues to feel pain in his upper abdomen into his back. Again we confirmed he is not taking any over-the-counter medications, no Tylenol no alcohol etc.    Review of Systems  Constitutional: Positive for activity  change, appetite change and fatigue.  HENT: Negative.   Eyes: Negative.   Respiratory: Negative.   Cardiovascular: Negative.   Gastrointestinal: Positive for abdominal pain.  Genitourinary: Negative.   Musculoskeletal: Negative.   Neurological: Positive for weakness.  Hematological: Negative.   Psychiatric/Behavioral: Negative.    Outpatient Encounter Prescriptions as of 08/25/2011  Medication Sig Dispense Refill  . DISCONTD: ibuprofen (ADVIL,MOTRIN) 200 MG tablet Take 200 mg by mouth every 6 (six) hours as needed. For pain      No Known Allergies Patient Active Problem List  Diagnosis  . Acute hepatitis      History   Social History  . Marital Status: Married    Spouse Name: N/A    Number of Children: N/A  . Years of Education: N/A   Occupational History  . Not on file.   Social History Main Topics  . Smoking status: Never Smoker   . Smokeless tobacco: Never Used  . Alcohol Use: 1.2 oz/week    2 Cans of beer per week     once a week  . Drug Use: No  . Sexually Active: Not Currently   Other Topics Concern  . Not on file   Social History Narrative  . No narrative on file     Objective:   Physical Exam   well-developed, jaundiced [place] male, in no acute distress. Accompanied by his wife and an interpreter. Neither the patient nor his wife speak [place]. Blood pressure 86/60 pulse 80 height 5 foot 7 weight 162, this  is stable actually up 3 pounds since last visit. HEENT; nontraumatic normocephalic EOMI PERRLA sclera are . icteric. Cardiovascula;r regular rate and rhythm with S1-S2 he has a soft systolic murmur, Pulmonary; clear bilaterally, Abdomen; flat, soft he is tender in the epigastrium there is no definite palpable hepatosplenomegaly no guarding or rebound and no fluid wave, bowel sounds are present, Rectal; not done, Extremities; no clubbing cyanosis or edema, no rash. Psych; mood and affect appear appropriate        Assessment & Plan:  #55 41 year old  male with what appears to be in acute hepatitis persistent marked transaminitis and hyperbilirubinemia. He is feeling poorly, is very fatigued and is unable to eat much. Etiology of his hepatitis is unclear. Initial studies for hepatitis A, B, and C are negative. ANA is positive but titer is negative. Will consider other viral etiologies i.e. hepatitis E., will also send off other chronic hepatic markers with anti-smooth muscle antibody alpha-1 antitrypsin level and a globulin levels antimitochondrial antibody. We will also send a Monospot., Will repeat CBC and be met today as well CMV and EBV serologies are pending. I attempted discussion about potential need for liver biopsy which was distressing to them, and clear to me that they do not understand . this may be needed and what it involves. I also discussed hospitalization, as he is feeling worse They refused hospitalization today, he is concerned about his job and needs to let his employer know he may need to be hospitalized. He is agreeable to hospitalization tomorrow. I think this may be indicated, also due to significant language barrier it may be very difficult to do further workup as an outpatient. Patient was advised to stay home and rest, he was given a note for work to cover him for the next 2 weeks.

## 2011-08-25 NOTE — Patient Instructions (Addendum)
Please go to the basement level to have your labs drawn.  We have given you a work note. We will call tomorrow with a time for you to go to the hospital.

## 2011-08-26 ENCOUNTER — Encounter (HOSPITAL_COMMUNITY): Payer: Self-pay | Admitting: Physician Assistant

## 2011-08-26 ENCOUNTER — Inpatient Hospital Stay (HOSPITAL_COMMUNITY)
Admission: AD | Admit: 2011-08-26 | Discharge: 2011-08-31 | DRG: 442 | Disposition: A | Discharge status: Home / Self Care | Payer: 59 | Source: Ambulatory Visit | Attending: Internal Medicine | Admitting: Internal Medicine

## 2011-08-26 ENCOUNTER — Telehealth: Payer: Self-pay | Admitting: *Deleted

## 2011-08-26 ENCOUNTER — Inpatient Hospital Stay (HOSPITAL_COMMUNITY): Payer: 59

## 2011-08-26 DIAGNOSIS — R17 Unspecified jaundice: Secondary | ICD-10-CM | POA: Diagnosis present

## 2011-08-26 DIAGNOSIS — K754 Autoimmune hepatitis: Principal | ICD-10-CM | POA: Diagnosis present

## 2011-08-26 DIAGNOSIS — B179 Acute viral hepatitis, unspecified: Secondary | ICD-10-CM

## 2011-08-26 LAB — IGG, IGA, IGM
IgG (Immunoglobin G), Serum: 3890 mg/dL — ABNORMAL HIGH (ref 650–1600)
IgM, Serum: 263 mg/dL — ABNORMAL HIGH (ref 41–251)

## 2011-08-26 LAB — ALPHA-1-ANTITRYPSIN: A-1 Antitrypsin, Ser: 187 mg/dL (ref 90–200)

## 2011-08-26 MED ORDER — PANTOPRAZOLE SODIUM 40 MG PO TBEC
40.0000 mg | DELAYED_RELEASE_TABLET | Freq: Every day | ORAL | Status: DC
  Administered 2011-08-26 – 2011-08-30 (×5): 40 mg via ORAL
  Filled 2011-08-26 (×5): qty 1

## 2011-08-26 MED ORDER — HYDROCODONE-ACETAMINOPHEN 5-325 MG PO TABS
1.0000 | ORAL_TABLET | ORAL | Status: DC | PRN
  Administered 2011-08-27 (×2): 1 via ORAL
  Filled 2011-08-26 (×2): qty 1

## 2011-08-26 MED ORDER — IOHEXOL 300 MG/ML  SOLN: 100.0000 mL | Freq: Once | INTRAMUSCULAR | Status: AC | PRN

## 2011-08-26 MED ORDER — IOHEXOL 300 MG/ML  SOLN
20.0000 mL | INTRAMUSCULAR | Status: AC
  Administered 2011-08-26: 20 mL via ORAL

## 2011-08-26 MED ORDER — ONDANSETRON HCL 4 MG/2ML IJ SOLN: 4.0000 mg | Freq: Four times a day (QID) | INTRAMUSCULAR | Status: DC | PRN

## 2011-08-26 MED ORDER — HYDROMORPHONE HCL PF 1 MG/ML IJ SOLN
0.5000 mg | INTRAMUSCULAR | Status: DC | PRN
  Administered 2011-08-30: 0.5 mg via INTRAVENOUS
  Filled 2011-08-26: qty 1

## 2011-08-26 MED ORDER — ENSURE COMPLETE PO LIQD
237.0000 mL | Freq: Two times a day (BID) | ORAL | Status: DC
  Administered 2011-08-27 – 2011-08-30 (×6): 237 mL via ORAL

## 2011-08-26 MED ORDER — KCL IN DEXTROSE-NACL 10-5-0.45 MEQ/L-%-% IV SOLN
INTRAVENOUS | Status: DC
  Administered 2011-08-26 – 2011-08-31 (×12): via INTRAVENOUS
  Filled 2011-08-26 (×16): qty 1000

## 2011-08-26 MED ORDER — ONDANSETRON HCL 4 MG PO TABS: 4.0000 mg | ORAL_TABLET | Freq: Four times a day (QID) | ORAL | Status: DC | PRN

## 2011-08-26 NOTE — Progress Notes (Signed)
Pt admitted to the unit. Pt is alert and oriented.  Pt speaks [place] only with family at the bedside for interpretation. Pt oriented to room, staff, and call bell. Bed in lowest position. Full assessment to Epic. Call bell with in reach. Told to call for assists. Will continue to monitor. Burley Saver, RN

## 2011-08-26 NOTE — Telephone Encounter (Signed)
I had the Telephone Interpreting speak to the patient's wife, Tristan Schroeder at [phone].

## 2011-08-26 NOTE — Telephone Encounter (Signed)
Called the Telephone Interpreting, [phone].  Through them we advised the pt's wife that he should go to admitting department at [facility]. He will be in room 6700.  I also called [phone], let them know the pt is going to admitting at [facility] and we need a [place] interpreter. I told her I was told by Selena Batten at Bed Placement, [phone].

## 2011-08-26 NOTE — H&P (Signed)
Primary Care Physician:  Abbe Amsterdam, MD Primary Gastroenterologist:  Leone Payor  CHIEF COMPLAINT:  Fatigue, jaundice, abdominal pain and inability to eat  HPI: Michael West is a 41 y.o. male who is [place] and does not speak any [place]. He has been seen in our office twice over the past week, and was referred for an acute hepatitis type picture. He initially says that he became ill around July 4 and has been feeling progressively worse since then. At onset of his illness he had epigastric discomfort, fever and decrease in his appetite. When he has been seen in the office he has complained of epigastric discomfort radiating through to his back which he says is constant. He is no longer having fever but has had progressive fatigue and is unable to work and says he has absolutely no energy. His appetite has been very poor and he is only able to keep very small amounts of food down. Decision was made to admit him yesterday as he has been feeling worse over the past week Also the language barrier has been a significant issue. Upper abdominal ultrasound was done on 08/17/2011 and showed a thickened gallbladder wall felt possibly due to hypoproteinemia cobbler although 7.6 mm there were no gallstones and no ductal dilation, the liver appeared normal at that time pancreas was not seen.  Acute hepatitis A, B, and C serologies are negative, serologies for CMV and EBV are pending, Monospot is negative. ANA was positive but titer was negative. Yesterday we sent off other chronic hepatic markers and these are for the most part pending. He is admitted for supportive care, and likely will need a liver biopsy if remaining labs are not conclusive.   Past Medical History  Diagnosis Date  . Rectal pain     Past Surgical History  Procedure Date  . Colonoscopy   . Hemorrhoid surgery     Prior to Admission medications   Not on File    Current Facility-Administered Medications  Medication Dose Route  Frequency Provider Last Rate Last Dose  . dextrose 5 % and 0.45 % NaCl with KCl 10 mEq/L infusion   Intravenous Continuous Amy S Esterwood, PA      . feeding supplement (ENSURE COMPLETE) liquid 237 mL  237 mL Oral BID BM Amy S Esterwood, PA      . HYDROcodone-acetaminophen (NORCO/VICODIN) 5-325 MG per tablet 1-2 tablet  1-2 tablet Oral Q4H PRN Amy Oswald Hillock, PA      . HYDROmorphone (DILAUDID) injection 0.5 mg  0.5 mg Intravenous Q4H PRN Amy S Esterwood, PA      . ondansetron (ZOFRAN) tablet 4 mg  4 mg Oral Q6H PRN Amy S Esterwood, PA       Or  . ondansetron (ZOFRAN) injection 4 mg  4 mg Intravenous Q6H PRN Amy S Esterwood, PA      . pantoprazole (PROTONIX) EC tablet 40 mg  40 mg Oral Q0600 Amy S Esterwood, PA        Allergies as of 08/25/2011  . (No Known Allergies)    Family History  Problem Relation Age of Onset  . Colon cancer Mother     History   Social History  . Marital Status: Married    Spouse Name: N/A    Number of Children: N/A  . Years of Education: N/A   Occupational History  . Not on file.   Social History Main Topics  . Smoking status: Never Smoker   . Smokeless tobacco: Never Used  .  Alcohol Use: 1.2 oz/week    2 Cans of beer per week     once a week  . Drug Use: No  . Sexually Active: Not Currently   Other Topics Concern  . Not on file   Social History Narrative  . No narrative on file    Review of Systems: Pertinent positive and negative review of systems were noted in the above HPI section.  All other review of systems was otherwise negative.  Physical Exam: Vital signs in last 24 hours: Temp:  [98.2 F (36.8 C)] 98.2 F (36.8 C) (07/19 1112) Pulse Rate:  [80-84] 84  (07/19 1112) Resp:  [14] 14  (07/19 1112) BP: (86-106)/(60-73) 106/73 mmHg (07/19 1112) SpO2:  [96 %] 96 % (07/19 1112) Weight:  [160 lb 0.9 oz (72.6 kg)-162 lb (73.483 kg)] 160 lb 0.9 oz (72.6 kg) (07/19 1112)   General:   Alert,  Well-developed,jaundiced ,fatigued ,  pleasant and cooperative in NAD Head:  Normocephalic and atraumatic. Eyes:  Sclera icteric   Conjunctiva pink. Ears:  Normal auditory acuity. Nose:  No deformity, discharge,  or lesions. Mouth:  No deformity or lesions.  Oropharynx pink & moist. Neck:  Supple; no masses or thyromegaly. Lungs:  Clear throughout to auscultation.   No wheezes, crackles, or rhonchi. No acute distress. Heart:  Regular rate and rhythm; no murmurs, clicks, rubs,  or gallops. Abdomen:  Soft, tender  Across upper abdomen,,nondistended. No masses, hepatosplenomegaly or hernias noted. Normal bowel sounds, without guarding, and without rebound.   Rectal:  Deferred .   Msk:  Symmetrical without gross deformities. Normal posture. Pulses:  Normal pulses noted. Extremities:  Without clubbing or edema. Neurologic:  Alert and  oriented x4;  grossly normal neurologically. Skin:  Intact without significant lesions or rashes.  Psych:  Alert and cooperative. Normal mood and affect.  Intake/Output from previous day:   Intake/Output this shift:    Lab Results:  Basename 08/25/11 1527  WBC 5.7  HGB 12.6*  HCT 37.3*  PLT 184.0   BMET  Basename 08/25/11 1527  NA 135  K 3.7  CL 98  CO2 29  GLUCOSE 95  BUN 13  CREATININE 0.5  CALCIUM 8.8   LFT  Basename 08/25/11 1527 08/24/11 0936  PROT 8.7* --  ALBUMIN 3.4* --  AST 1632* --  ALT 1123* --  ALKPHOS 122* --  BILITOT 14.8* --  BILIDIR -- 8.2*  IBILI -- --   PT/INR  Basename 08/24/11 0936  LABPROT 12.8*  INR 1.2*   Hepatitis Panel No results found for this basename: HEPBSAG,HCVAB,HEPAIGM,HEPBIGM in the last 72 hours    Impression / Plan:  #49 41 year old [place] male with what appears to be in acute severe hepatitis, he does not have evidence of hepatic failure with normal INR to date. Etiology of his hepatitis is at this time unclear, multiple  labs are pending. Initial workup for hepatitis A, B, and C acute is negative, EBV and CMV are pending,  ANA titer was negative  Plan; Asian is admitted to the GI service 04 supportive management with IV fluids, antibiotics, analgesics, will start on Protonix. We'll check CT scan of the abdomen and pelvis. Await multiple pending hepatic markers etc. Anticipate liver biopsy once labs returned if we do not have a definite diagnosis      LOS: 0 days   Amy Esterwood  08/26/2011, 12:11 PM

## 2011-08-26 NOTE — H&P (Signed)
Patient seen, examined, and I agree with the above documentation, including the assessment and plan. Very robust transaminitis with jaundice. Differential includes viral hepatitis, which evaluation is been negative today. We are awaiting hepatitis E IgM, Hep Be Ag, drug-induced liver injury which seems unlikely, ischemia also which is unlikely, autoimmune which the ANA was positive with a titer less than 1:40 making this unlikely, and Wilson's but ceruloplasmin is normal. Currently there is no sign of acute liver failure, INR is normal and he is not encephalopathic CT scan unrevealing I recommend liver biopsy for further characterization of his liver injury. If there is any sign of hepatic decompensation he will need transfer to a hospital for liver transplant is an option

## 2011-08-26 NOTE — Progress Notes (Signed)
INITIAL ADULT NUTRITION ASSESSMENT Date: 08/26/2011   Time: 3:10 PM  Reason for Assessment: Nutrition Risk Report  ASSESSMENT: Male 41 y.o.  Dx: Fatigue, jaundice, abdominal pain and inability to eat  Hx:  Past Medical History  Diagnosis Date  . Rectal pain    Past Surgical History  Procedure Date  . Colonoscopy   . Hemorrhoid surgery    Related Meds:     . feeding supplement  237 mL Oral BID BM  . iohexol  20 mL Oral Q1 Hr x 2  . pantoprazole  40 mg Oral Q0600   Ht: 5\' 7"  (170.2 cm)  Wt: 160 lb 0.9 oz (72.6 kg)  Ideal Wt: 67.3 kg % Ideal Wt: 108%  Wt Readings from Last 15 Encounters:  08/26/11 160 lb 0.9 oz (72.6 kg)  08/25/11 162 lb (73.483 kg)  08/19/11 159 lb 12.8 oz (72.485 kg)  08/17/11 160 lb 3.2 oz (72.666 kg)  Usual Wt: pt unable to report % Usual Wt: n/a  Body mass index is 25.07 kg/(m^2). Pt is overweight.  Food/Nutrition Related Hx: pt with poor intake and wt loss x 15 days  Labs:  CMP     Component Value Date/Time   NA 135 08/25/2011 1527   K 3.7 08/25/2011 1527   CL 98 08/25/2011 1527   CO2 29 08/25/2011 1527   GLUCOSE 95 08/25/2011 1527   BUN 13 08/25/2011 1527   CREATININE 0.5 08/25/2011 1527   CREATININE 0.72 08/17/2011 0911   CALCIUM 8.8 08/25/2011 1527   PROT 8.7* 08/25/2011 1527   ALBUMIN 3.4* 08/25/2011 1527   AST 1632* 08/25/2011 1527   ALT 1123* 08/25/2011 1527   ALKPHOS 122* 08/25/2011 1527   BILITOT 14.8* 08/25/2011 1527   No intake or output data in the 24 hours ending 08/26/11 1510  Diet Order: Full Liquid  Supplements/Tube Feeding: Ensure Complete PO BID between meals  IVF:     dextrose 5 % and 0.45 % NaCl with KCl 10 mEq/L Last Rate: 100 mL/hr at 08/26/11 1437   Estimated Nutritional Needs:   Kcal: 1940 - 2265 kcal Protein:  72 - 86 grams Fluid: 1.9 - 2.2 liters daily  RD drawn to chart 2/2 Nutrition Risk Report. Per report, pt stated he has unintentionally lost 15 lb recently.  Current admitted with complaints of  fatigue, jaundice, abdominal pain, and inability to eat. Per GI H&P, pt became ill around July 4 and has been feeling progressively worse since then. Pt initially developed epigastric discomfort and poor intake. Pt states he is now unable to work because he has very little energy. Intake has been poor and is only able to keep very small amounts of food down.  GI suspects pt has acute severe hepatitis of unknown etiology. Hep A, B, C acute are negative. Anticipate need for liver bx if no definitive dx.  This RD spoke with pt using [place] [phone]. Pt confirmed all information in the GI note. Noted that his "calves are shrinking" when asked about his weight loss. Pt states that he has lost 15 - 17 lb x 15 days. Pt only able to tolerate small amounts of foods. Usually eats [place] foods but will eat "all kinds" of [place] foods. Discussed Ensure Complete supplement and encouraged pt to consume between meals as able.  Per Epic weight's pt has weighed between 159 - 162 lb x 10 days. Current wt is 160 lb. Pt is at some nutrition risk given recent decline in PO intake  and likely body fat/muscle mass loss.  NUTRITION DIAGNOSIS: -Inadequate oral intake (NI-2.1).  Status: Ongoing  RELATED TO: poor appetite  AS EVIDENCE BY: pt report of decreased intake and weight x 15 days.  MONITORING/EVALUATION(Goals): Goal: Advance diet as tolerated.  EDUCATION NEEDS: -No education needs identified at this time  INTERVENTION: 1. Recommend continuation of Ensure Complete PO BID to help meet nutritional needs. Note pt has yet to receive this supplement. If pt does not like Ensure, recommend Resource Breeze PO BID. 2. Advance diet as medically able to Regular 3. RD to continue to follow nutrition care plan   DOCUMENTATION CODES Per approved criteria  -Not Applicable   Jarold Motto MS, RD, LDN Pager: [phone] After-hours pager: [phone]

## 2011-08-26 NOTE — Progress Notes (Signed)
Agree with Ms. Esterwood's assessment and plan. Carl E. Gessner, MD, FACG   

## 2011-08-27 DIAGNOSIS — R17 Unspecified jaundice: Secondary | ICD-10-CM

## 2011-08-27 LAB — COMPREHENSIVE METABOLIC PANEL
ALT: 974 U/L — ABNORMAL HIGH (ref 0–53)
Albumin: 2.8 g/dL — ABNORMAL LOW (ref 3.5–5.2)
Calcium: 8.8 mg/dL (ref 8.4–10.5)
GFR calc Af Amer: >90 mL/min (ref >90)
Glucose, Bld: 101 mg/dL — ABNORMAL HIGH (ref 70–99)
Sodium: 135 mEq/L (ref 135–145)
Total Protein: 7.3 g/dL (ref 6.0–8.3)

## 2011-08-27 LAB — PROTIME-INR
INR: 1.25 (ref 0.00–1.49)
Prothrombin Time: 16 seconds — ABNORMAL HIGH (ref 11.6–15.2)

## 2011-08-27 NOTE — Progress Notes (Signed)
Patient ID: Michael West, male   DOB: 12-11-1970, 41 y.o.   MRN: [id number] [name] Progress Note  Subjective:  Spoke with pt via interpreter  Per phone.Marland Kitchen He feels better than yesterday in general , able to eat full liquids- would like to try solid food. Less abdominal pain.  Ct abd/pelvis- negative Objective:  Vital signs in last 24 hours: Temp:  [98.2 F (36.8 C)-98.5 F (36.9 C)] 98.3 F (36.8 C) (07/20 0424) Pulse Rate:  [65-84] 76  (07/20 0424) Resp:  [14-16] 16  (07/20 0424) BP: (102-110)/(65-73) 102/65 mmHg (07/20 0424) SpO2:  [91 %-96 %] 91 % (07/20 0424) Weight:  [159 lb 1.6 oz (72.167 kg)-160 lb 0.9 oz (72.6 kg)] 159 lb 1.6 oz (72.167 kg) (07/19 2119) Last BM Date: 08/23/11 General:   Alert,  Well-developed,    in NAD Jauniced Heart:  Regular rate and rhythm; no murmurs Pulm;clear Abdomen:  Soft, mildly tender epigastrium  and nondistended. Normal bowel sounds, without guarding,   Extremities:  Without edema. Neurologic:  Alert and  oriented x4;  grossly normal neurologically. Psych:  Alert and cooperative. Normal mood and affect.  Intake/Output from previous day: 07/19 0701 - 07/20 0700 In: 1538.3 [I.V.:1538.3] Out: -  Intake/Output this shift:    Lab Results:  Basename 08/25/11 1527  WBC 5.7  HGB 12.6*  HCT 37.3*  PLT 184.0   BMET  Basename 08/27/11 0604 08/25/11 1527  NA 135 135  K 3.8 3.7  CL 102 98  CO2 25 29  GLUCOSE 101* 95  BUN 9 13  CREATININE 0.57 0.5  CALCIUM 8.8 8.8   LFT  Basename 08/27/11 0604 08/24/11 0936  PROT 7.3 --  ALBUMIN 2.8* --  AST 1453* --  ALT 974* --  ALKPHOS 105 --  BILITOT 12.9* --  BILIDIR -- 8.2*  IBILI -- --   PT/INR  Basename 08/27/11 0604 08/24/11 0936  LABPROT 16.0* 12.8*  INR 1.25 1.2*     Assessment / Plan:  41 yo viet[place] male with acute severe hepatitis without evidence for liver failure. Etiology still not clear-  Hep E , Hep B e  Pending, CMV, and EBV pending, though monospot  negative Ceruloplasmin, alpha1 antitrypsin, normal ANA +/ titer negative  Await pending labs, transaminases trending down Advance diet Plan is for US guided liver BX on Monday  Active Problems:  * No active hospital problems. *      LOS: 1 day   Michael West  08/27/2011, 9:28 AM

## 2011-08-27 NOTE — Progress Notes (Addendum)
Patient seen, examined, and I agree with the above documentation, including the assessment and plan. Slight improvement in transaminases today, though still robustly elevated. Bilirubin slightly decreased today but still 12 IgG is very elevated, but ANA was less than 1:40, we will check additional auto antibodies that can be associated with autoimmune hepatitis including anti-SLA, ANCA, anti-LKM, anti-SMA We will proceed with liver biopsy Acute hepatitis E serology still pending EBV and CMV pending, but unlikely INR is holding steady, no evidence for encephalopathy If INR begins to rise or he develops encephalopathy he will need transfer to a Medical Center where liver transplantation is available Daily hepatic function panel and INR  Addendum: I contacted radiology to seek liver biopsy over the weekend. There are no procedures in place where liver biopsy can be performed at this hospital over the weekend. Therefore we will plan for urgent liver biopsy Monday morning. Again, there is any sign of decompensation we will transfer patient to tertiary care center with liver transplant capability.

## 2011-08-28 ENCOUNTER — Encounter (HOSPITAL_COMMUNITY): Payer: Self-pay | Admitting: Radiology

## 2011-08-28 LAB — HEPATIC FUNCTION PANEL
ALT: 994 U/L — ABNORMAL HIGH (ref 0–53)
AST: 1448 U/L — ABNORMAL HIGH (ref 0–37)
Albumin: 2.9 g/dL — ABNORMAL LOW (ref 3.5–5.2)
Alkaline Phosphatase: 126 U/L — ABNORMAL HIGH (ref 39–117)
Total Protein: 7.7 g/dL (ref 6.0–8.3)

## 2011-08-28 LAB — FERRITIN: Ferritin: 7722 ng/mL — ABNORMAL HIGH (ref 22–322)

## 2011-08-28 NOTE — Progress Notes (Signed)
Patient seen, examined, and I agree with the above documentation, including the assessment and plan. Awaiting send-out serologies.  AIH still in differential LFTs very elevated but improving. No encephalopathy, INR okay

## 2011-08-28 NOTE — H&P (Addendum)
Michael West is an 41 y.o. male.   Chief Complaint: hepatitis N/V; wt loss; abd pain; increased LFTs Scheduled for random liver core biopsy 7/22 in IR HPI: Hepatitis  Past Medical History  Diagnosis Date  . Rectal pain     Past Surgical History  Procedure Date  . Colonoscopy   . Hemorrhoid surgery     Family History  Problem Relation Age of Onset  . Colon cancer Mother    Social History:  reports that he has never smoked. He has never used smokeless tobacco. He reports that he drinks about 1.2 ounces of alcohol per week. He reports that he does not use illicit drugs.  Allergies: No Known Allergies  No prescriptions prior to admission    Results for orders placed during the hospital encounter of 08/26/11 (from the past 48 hour(s))  C-REACTIVE PROTEIN     Status: Abnormal   Collection Time   08/26/11  1:02 PM      Component Value Range Comment   CRP 0.7 (*) <0.60 mg/dL   COMPREHENSIVE METABOLIC PANEL     Status: Abnormal   Collection Time   08/27/11  6:04 AM      Component Value Range Comment   Sodium 135  135 - 145 mEq/L    Potassium 3.8  3.5 - 5.1 mEq/L    Chloride 102  96 - 112 mEq/L    CO2 25  19 - 32 mEq/L    Glucose, Bld 101 (*) 70 - 99 mg/dL    BUN 9  6 - 23 mg/dL    Creatinine, Ser 4.54  0.50 - 1.35 mg/dL    Calcium 8.8  8.4 - 09.8 mg/dL    Total Protein 7.3  6.0 - 8.3 g/dL    Albumin 2.8 (*) 3.5 - 5.2 g/dL    AST 1191 (*) 0 - 37 U/L    ALT 974 (*) 0 - 53 U/L    Alkaline Phosphatase 105  39 - 117 U/L    Total Bilirubin 12.9 (*) 0.3 - 1.2 mg/dL    GFR calc non Af Amer >90  >90 mL/min    GFR calc Af Amer >90  >90 mL/min   PROTIME-INR     Status: Abnormal   Collection Time   08/27/11  6:04 AM      Component Value Range Comment   Prothrombin Time 16.0 (*) 11.6 - 15.2 seconds    INR 1.25  0.00 - 1.49   FERRITIN     Status: Abnormal   Collection Time   08/27/11  3:23 PM      Component Value Range Comment   Ferritin 7722 (*) 22 - 322 ng/mL Result confirmed  by automatic dilution.  HEPATIC FUNCTION PANEL     Status: Abnormal   Collection Time   08/28/11  5:32 AM      Component Value Range Comment   Total Protein 7.7  6.0 - 8.3 g/dL    Albumin 2.9 (*) 3.5 - 5.2 g/dL    AST 4782 (*) 0 - 37 U/L    ALT 994 (*) 0 - 53 U/L    Alkaline Phosphatase 126 (*) 39 - 117 U/L    Total Bilirubin 12.5 (*) 0.3 - 1.2 mg/dL    Bilirubin, Direct 8.1 (*) 0.0 - 0.3 mg/dL    Indirect Bilirubin 4.4 (*) 0.3 - 0.9 mg/dL   PROTIME-INR     Status: Abnormal   Collection Time   08/28/11  5:32 AM  Component Value Range Comment   Prothrombin Time 15.4 (*) 11.6 - 15.2 seconds    INR 1.19  0.00 - 1.49    Ct Abdomen Pelvis W Contrast  08/26/2011  *RADIOLOGY REPORT*  Clinical Data: Abdominal and rectal pain.  Chronic hepatitis.  CT ABDOMEN AND PELVIS WITH CONTRAST  Technique:  Multidetector CT imaging of the abdomen and pelvis was performed following the standard protocol during bolus administration of intravenous contrast.  Contrast:  100 ml Omnipaque-300 and oral contrast  Comparison: None  Findings: A few tiny less than 1 cm low attenuation lesions are seen in the liver which are too small to characterize but most likely represent tiny cysts.  No definite liver masses are identified.  Gallbladder is unremarkable.  No evidence of biliary ductal dilatation.  The pancreas, spleen, adrenal glands, and kidneys are normal in appearance.  No evidence of hydronephrosis.  No soft tissue masses or lymphadenopathy identified within the abdomen.  No evidence of pelvic soft tissue masses or lymphadenopathy. Normal appendix is visualized.  No evidence of inflammatory process or abnormal fluid collections.  No evidence of bowel wall thickening or dilatation.  IMPRESSION: No acute findings or other significant abnormality.  Original Report Authenticated By: Danae Orleans, M.D.    Review of Systems  Constitutional: Positive for weight loss. Negative for fever.  Respiratory: Negative for  cough.   Cardiovascular: Negative for chest pain.  Gastrointestinal: Positive for nausea and abdominal pain.    Blood pressure 94/58, pulse 64, temperature 97.5 F (36.4 C), temperature source Oral, resp. rate 20, height 5\' 7"  (1.702 m), weight 160 lb 1.6 oz (72.621 kg), SpO2 94.00%. Physical Exam  Constitutional: He is oriented to person, place, and time. He appears well-developed and well-nourished.  Cardiovascular: Normal rate, regular rhythm and normal heart sounds.   No murmur heard. Respiratory: Effort normal and breath sounds normal. He has no wheezes.  GI: Soft. Bowel sounds are normal. There is tenderness.  Musculoskeletal: Normal range of motion.  Neurological: He is oriented to person, place, and time.  Psychiatric: He has a normal mood and affect. His behavior is normal. Judgment and thought content normal.     Assessment/Plan Hepatitis; increased LFTs Scheduled for random liver bx 7/22 in IR Consented pt with interpreter via phone He is aware of procedure benefits and risks and agreeable to proceed. Consent in chart  Charna Neeb A 08/28/2011, 8:17 AM

## 2011-08-28 NOTE — Progress Notes (Signed)
Patient ID: Michael West, male   DOB: [birth year], 41 y.o.   MRN: [id number] [facility] Progress Note  Subjective: Interviewed pt via phone interpreter- he is feeling better, stronger. No headache, no nausea, able to eat solid food. Discussed drug use and tylenol  Use again- denies any drug use only took tylenol after he became ill and not in large quantity  Objective:  Vital signs in last 24 hours: Temp:  [97.5 F (36.4 C)-98.7 F (37.1 C)] 97.9 F (36.6 C) (07/21 1011) Pulse Rate:  [64-90] 65  (07/21 1011) Resp:  [16-22] 19  (07/21 1011) BP: (94-112)/(58-79) 99/67 mmHg (07/21 1011) SpO2:  [94 %-99 %] 99 % (07/21 1011) Weight:  [160 lb 1.6 oz (72.621 kg)] 160 lb 1.6 oz (72.621 kg) (07/20 2154) Last BM Date: 08/23/11 General:   Alert,  Well-developed,    in NAD,jaundiced Heart:  Regular rate and rhythm; no murmurs Pulm;clear Abdomen:  Soft, mildly tender epigastrium and RUQ,and nondistended. Normal bowel sounds, without guarding, and without rebound.   Extremities:  Without edema. Neurologic:  Alert and  oriented x4;  grossly normal neurologically., no asterixis Psych:  Alert and cooperative. Normal mood and affect.  Intake/Output from previous day: 07/20 0701 - 07/21 0700 In: 2100 [P.O.:900; I.V.:1200] Out: 1602 [Urine:1602] Intake/Output this shift:    Lab Results:  Basename 08/25/11 1527  WBC 5.7  HGB 12.6*  HCT 37.3*  PLT 184.0   BMET  Basename 08/27/11 0604 08/25/11 1527  NA 135 135  K 3.8 3.7  CL 102 98  CO2 25 29  GLUCOSE 101* 95  BUN 9 13  CREATININE 0.57 0.5  CALCIUM 8.8 8.8   LFT  Basename 08/28/11 0532  PROT 7.7  ALBUMIN 2.9*  AST 1448*  ALT 994*  ALKPHOS 126*  BILITOT 12.5*  BILIDIR 8.1*  IBILI 4.4*   PT/INR  Basename 08/28/11 0532 08/27/11 0604  LABPROT 15.4* 16.0*  INR 1.19 1.25    Assessment / Plan: #1  41 yo male  With acute severe hepatitis without liver failure- etiology unclear , several labs still pending,  concerned about possible autoimmune hepatitis as IGG levels quite high. ANA +, but negative titer- other markers pending Hepatitis A,B,C- all negative Hepatitis E  Pending  His transaminases are finally trending down, and INR today normal at 1.19, no encephalopathy evident Liver BX is scheduled for am tomorrow Pt aware, and questions answered Active Problems:  Jaundice     LOS: 2 days   Ollis Daudelin  08/28/2011, 10:42 AM

## 2011-08-29 ENCOUNTER — Inpatient Hospital Stay (HOSPITAL_COMMUNITY): Payer: 59

## 2011-08-29 DIAGNOSIS — K7200 Acute and subacute hepatic failure without coma: Secondary | ICD-10-CM

## 2011-08-29 LAB — HEPATIC FUNCTION PANEL
ALT: 889 U/L — ABNORMAL HIGH (ref 0–53)
AST: 1248 U/L — ABNORMAL HIGH (ref 0–37)
Bilirubin, Direct: 7.3 mg/dL — ABNORMAL HIGH (ref 0.0–0.3)

## 2011-08-29 LAB — CBC
Hemoglobin: 11.3 g/dL — ABNORMAL LOW (ref 13.0–17.0)
MCV: 83.6 fL (ref 78.0–100.0)
Platelets: 158 10*3/uL (ref 150–400)
RBC: 3.83 MIL/uL — ABNORMAL LOW (ref 4.22–5.81)
WBC: 4.8 10*3/uL (ref 4.0–10.5)

## 2011-08-29 LAB — HEPATITIS B E ANTIGEN: Hep B E Ag: NEGATIVE

## 2011-08-29 LAB — MITOCHONDRIAL/SMOOTH MUSCLE AB PNL: Mitochondrial M2 Ab, IgG: 0.77 (ref <0.91)

## 2011-08-29 MED ORDER — FENTANYL CITRATE 0.05 MG/ML IJ SOLN
INTRAMUSCULAR | Status: AC
  Filled 2011-08-29: qty 4

## 2011-08-29 MED ORDER — MIDAZOLAM HCL 2 MG/2ML IJ SOLN
INTRAMUSCULAR | Status: AC
  Filled 2011-08-29: qty 6

## 2011-08-29 MED ORDER — FENTANYL CITRATE 0.05 MG/ML IJ SOLN
INTRAMUSCULAR | Status: DC | PRN
  Administered 2011-08-29: 50 ug via INTRAVENOUS

## 2011-08-29 MED ORDER — MIDAZOLAM HCL 5 MG/5ML IJ SOLN
INTRAMUSCULAR | Status: DC | PRN
  Administered 2011-08-29: 2 mg via INTRAVENOUS

## 2011-08-29 MED ORDER — PREDNISONE 20 MG PO TABS
40.0000 mg | ORAL_TABLET | Freq: Every day | ORAL | Status: AC
  Administered 2011-08-29 – 2011-08-31 (×3): 40 mg via ORAL
  Filled 2011-08-29 (×3): qty 2

## 2011-08-29 MED FILL — Dextrose 5% w/ Sodium Chloride 0.45%: INTRAVENOUS | Qty: 1000 | Status: AC

## 2011-08-29 NOTE — Procedures (Signed)
Procedure : random liver core needle biopsy Specimen : 18 g cores x 4 Bleeding: minimal blood loss  meds : 2 mg versed, 50 mcg fentanyl for 15 minutes  No immediate complications.

## 2011-08-29 NOTE — Progress Notes (Signed)
[facility] Progress Note  SUBJECTIVE: No abdominal pain. Feels okay   OBJECTIVE:  Vital signs in last 24 hours: Temp:  [97.9 F (36.6 C)-98.4 F (36.9 C)] 98.4 F (36.9 C) (07/22 1039) Pulse Rate:  [59-81] 60  (07/22 1039) Resp:  [11-20] 12  (07/22 1010) BP: (87-112)/(61-78) 87/62 mmHg (07/22 1010) SpO2:  [91 %-100 %] 98 % (07/22 1039) Last BM Date: 08/28/11 General:    Asian male in NAD Heart:  Regular rate and rhythm; no murmurs Lungs: Respirations even and unlabored, lungs CTA bilaterally Abdomen:  Soft, nontender and nondistended. Normal bowel sounds. Liver biopsy site without bleeding Extremities:  Without edema. Neurologic:  Alert. Psych:  Cooperative.    Lab Results:  [facility] 08/29/11 0555  WBC 4.8  HGB 11.3*  HCT 32.0*  PLT 158   BMET  Basename 08/27/11 0604  NA 135  K 3.8  CL 102  CO2 25  GLUCOSE 101*  BUN 9  CREATININE 0.57  CALCIUM 8.8   LFT  Basename 08/29/11 0555  PROT 7.6  ALBUMIN 2.7*  AST 1248*  ALT 889*  ALKPHOS 119*  BILITOT 11.2*  BILIDIR 7.3*  IBILI 3.9*   PT/INR  Basename 08/29/11 0555 08/28/11 0532  LABPROT 15.5* 15.4*  INR 1.20 1.19    ASSESSMENT / PLAN:   Acute, severe hepatitis of unclear etiology.  His synthetic liver function is preserved (albumin normal on admission and protime only minimally elevated at 15.4). Spoke to patient via interpreter. No FMH of liver disease. Patient wasn't exposed to any new chemicals at work. He had fever and chills for 3 days prior to seeking medical care.Viral hepatitis studies negative. Rule out AIH (despite negative ANA titers). Awaiting additional autoimmune markers as his IgG is elevated.  Awaiting ASMA. His A-1 Antitrypsin and AMA are negative. LFTs are trending down across the board. Recheck am LFTs, await liver biopsy. Will treat empirically with 40mg  Prednisone daily for possible AIH.       Active Problems:  Jaundice     LOS: 3 days   Willette Cluster  08/29/2011,  11:11 AM

## 2011-08-29 NOTE — ED Notes (Signed)
O2 2L [place] 

## 2011-08-29 NOTE — Progress Notes (Signed)
[place] interpreter [phone] used for physician interpretation. Audie Pinto

## 2011-08-29 NOTE — Progress Notes (Signed)
Chart was reviewed and patient was examined. X-rays were reviewed.    I agree with management and plans.  Await liver biopsy  Barbette Hair. Arlyce Dice, M.D., [facility]

## 2011-08-29 NOTE — Progress Notes (Addendum)
Long conversation with wife and family friend who is translating for the wife, mother...  Lots of questions, not much insite into the hepatitis.  All  Questions to the best of my ability. [name]  PA-c   After 25 minutes of translated conversation, family revealed they dont like the hospital interpreters. They request that we use translator Y'bion Mlo who works as Nurse, learning disability for [employer]" [phone] Or call him directly at cell # [phone]. I made arrangements to see family tomorrow at 2:30 PM with mr Mlo present.  [name]  Pa-c Pager [phone]

## 2011-08-29 NOTE — Progress Notes (Signed)
Utilization review completed.  

## 2011-08-30 LAB — HEPATIC FUNCTION PANEL
ALT: 802 U/L — ABNORMAL HIGH (ref 0–53)
Albumin: 2.8 g/dL — ABNORMAL LOW (ref 3.5–5.2)
Alkaline Phosphatase: 114 U/L (ref 39–117)
Indirect Bilirubin: 3.9 mg/dL — ABNORMAL HIGH (ref 0.3–0.9)
Total Protein: 7.8 g/dL (ref 6.0–8.3)

## 2011-08-30 LAB — DRUGS OF ABUSE SCREEN W/O ALC, ROUTINE URINE
Barbiturate Quant, Ur: NEGATIVE
Cocaine Metabolites: NEGATIVE
Creatinine,U: 28.5 mg/dL
Marijuana Metabolite: NEGATIVE
Opiate Screen, Urine: NEGATIVE

## 2011-08-30 MED ORDER — SODIUM CHLORIDE 0.9 % IV SOLN: INTRAVENOUS | Status: AC

## 2011-08-30 MED ORDER — ENSURE COMPLETE PO LIQD: 237.0000 mL | Freq: Every day | ORAL | Status: DC | PRN

## 2011-08-30 NOTE — Progress Notes (Signed)
Nutrition Follow-up  Intervention:  Decrease Ensure Complete to PRN daily for poor intake at meals, as intake is currently adequate.  Assessment:   Pt able to eat solid food and tolerate well. Advanced to Regular diet on 7/20. Intake is optimal, eating 75 - 100% of meals. Nurse tech confirms this; states that he ate everything on his breakfast trays.  Liver bx completed on 7/22.  Diet Order:  Regular Supplements: Ensure Complete PO BID  Meds: Scheduled Meds:   . feeding supplement  237 mL Oral BID BM  . fentaNYL      . midazolam      . pantoprazole  40 mg Oral Q0600  . predniSONE  40 mg Oral QAC breakfast   Continuous Infusions:   . sodium chloride    . dextrose 5 % and 0.45 % NaCl with KCl 10 mEq/L 100 mL/hr at 08/30/11 0449   PRN Meds:.fentaNYL, HYDROcodone-acetaminophen, HYDROmorphone (DILAUDID) injection, midazolam, ondansetron (ZOFRAN) IV, ondansetron  Labs:  CMP     Component Value Date/Time   NA 135 08/27/2011 0604   K 3.8 08/27/2011 0604   CL 102 08/27/2011 0604   CO2 25 08/27/2011 0604   GLUCOSE 101* 08/27/2011 0604   BUN 9 08/27/2011 0604   CREATININE 0.57 08/27/2011 0604   CREATININE 0.72 08/17/2011 0911   CALCIUM 8.8 08/27/2011 0604   PROT 7.8 08/30/2011 0610   ALBUMIN 2.8* 08/30/2011 0610   AST 1024* 08/30/2011 0610   ALT 802* 08/30/2011 0610   ALKPHOS 114 08/30/2011 0610   BILITOT 10.6* 08/30/2011 0610   GFRNONAA >90 08/27/2011 0604   GFRAA >90 08/27/2011 0604     Intake/Output Summary (Last 24 hours) at 08/30/11 1043 Last data filed at 08/30/11 0900  Gross per 24 hour  Intake   1820 ml  Output      0 ml  Net   1820 ml  BM on 7/22  Weight Status:  160 lb (72.6 kg) - wt stable  Body mass index is 25.08 kg/(m^2). Overweight.  Estimated needs:  1940 - 2265 kcal, 72 - 86 grams protein  Nutrition Dx:  Inadequate oral intake r/t poor appetite AEB pt report of decreased intake and weight x 15 days. Improved.  Goal: Advanced diet as tolerated - met. New Goal:  Goal: Pt to meet >/= 90% of their estimated nutrition needs; met  Monitor:  Weights, labs, bx results  Jarold Motto MS, RD, LDN Pager: [phone] After-hours pager: [phone]

## 2011-08-30 NOTE — Progress Notes (Signed)
He probably can be d/ced tomorrow if he continues to improve clinically and LFTs are decreasing.

## 2011-08-30 NOTE — Progress Notes (Signed)
     Michael West Daily Rounding Note 08/30/2011, 11:22 AM  SUBJECTIVE:       Eating 75 to 100% of meals.  Dietary supplement therefore d/c'd No bellly pain.  Walking about in room  Without problem. Is having some pain in left mid back.   OBJECTIVE:         Vital signs in last 24 hours:    Temp:  [97.9 F (36.6 C)-98.8 F (37.1 C)] 98 F (36.7 C) (07/23 0953) Pulse Rate:  [59-77] 77  (07/23 0953) Resp:  [16-18] 18  (07/23 0953) BP: (92-111)/(64-76) 109/73 mmHg (07/23 0953) SpO2:  [94 %-95 %] 95 % (07/23 0953) Weight:  [160 lb 1.6 oz (72.621 kg)] 160 lb 1.6 oz (72.621 kg) (07/22 1958) Last BM Date: 08/29/11 General:  Less jaundiced.  No distress  Heart: RRR Chest: clear.  No cough or dyspnea Abdomen: soft, NT, ND.  No HSM  Extremities: no edema Neuro/Psych:  No agitation or anxiety present.  Pleasant, cooperative.  No asterixis or tremor   Lab Results:  [facility] 08/29/11 0555  WBC 4.8  HGB 11.3*  HCT 32.0*  PLT 158   LFT  Basename 08/30/11 0610 08/29/11 0555 08/28/11 0532  PROT 7.8 7.6 7.7  ALBUMIN 2.8* 2.7* 2.9*  AST 1024* 1248* 1448*  ALT 802* 889* 994*  ALKPHOS 114 119* 126*  BILITOT 10.6* 11.2* 12.5*  BILIDIR 6.7* 7.3* 8.1*  IBILI 3.9* 3.9* 4.4*   PT/INR  Basename 08/29/11 0555 08/28/11 0532  LABPROT 15.5* 15.4*  INR 1.20 1.19     Studies/Results: US Biopsy  08/29/2011  ULTRASOUND-GUIDED RANDOM CORE LIVER BIOPSY:  Impression: 1. Technically successful ultrasound guided random liver core biopsy with moderate sedation as described above. 2.  Indeterminate approximately 1.3 cm hypoechoic lesion within the dome of the right lobe the liver, not definitely seen on preprocedural abdominal CT.  Further evaluation of this lesion may be performed with a contrast enhanced abdominal MRI as clinically indicated.  Read by: Anselm Pancoast, P.A.-C  Original Report Authenticated By: Waynard Reeds, M.D.    ASSESMENT: *  Hepatitis, acute.  LFTs and jaundice all  improving. Elevated IgG and IgM, Ferritin, smooth muscle Ab positive.  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  Day 2 po Prednisone.  *  Language barrier in pt and family   PLAN: *  Continue Prednisone 40 mg daily *  Stop Protonix.  Was not using PTA.  No nausea.  *  Wait on the pathology report, should be back tomorrow.    LOS: 4 days   [name]  08/30/2011, 11:22 AM Pager: [phone]

## 2011-08-31 ENCOUNTER — Encounter: Payer: Self-pay | Admitting: Internal Medicine

## 2011-08-31 LAB — COMPREHENSIVE METABOLIC PANEL
BUN: 11 mg/dL (ref 6–23)
CO2: 26 mEq/L (ref 19–32)
Chloride: 104 mEq/L (ref 96–112)
Creatinine, Ser: 0.59 mg/dL (ref 0.50–1.35)
GFR calc non Af Amer: >90 mL/min (ref >90)
Total Bilirubin: 8.3 mg/dL — ABNORMAL HIGH (ref 0.3–1.2)

## 2011-08-31 MED ORDER — HYDROCODONE-ACETAMINOPHEN 5-325 MG PO TABS: 1.0000 | ORAL_TABLET | Freq: Four times a day (QID) | ORAL | Status: DC | PRN

## 2011-08-31 MED ORDER — PREDNISONE 20 MG PO TABS: 40.0000 mg | ORAL_TABLET | Freq: Every day | ORAL | Status: DC

## 2011-08-31 MED ORDER — PREDNISONE 20 MG PO TABS
40.0000 mg | ORAL_TABLET | Freq: Every day | ORAL | Status: DC
  Filled 2011-08-31: qty 2

## 2011-08-31 NOTE — Progress Notes (Signed)
     [facility] Daily Rounding Note 08/31/2011, 8:44 AM  SUBJECTIVE:       Eating well.  No nausea. No belly pain.  Ready to go home  OBJECTIVE:         Vital signs in last 24 hours:    Temp:  [97.5 F (36.4 C)-98.6 F (37 C)] 97.5 F (36.4 C) (07/24 0445) Pulse Rate:  [68-78] 73  (07/24 0445) Resp:  [18] 18  (07/24 0445) BP: (93-110)/(51-73) 93/51 mmHg (07/24 0445) SpO2:  [94 %-96 %] 94 % (07/24 0445) Weight:  [160 lb 1.6 oz (72.621 kg)] 160 lb 1.6 oz (72.621 kg) (07/23 2011) Last BM Date: 08/29/11 General: looks well.  Less jaundiced Heart: RRR Chest: clear B Abdomen:  Soft, NT, N Extremities: no edema, no bruising Neuro/Psych:  No confusion, seems to be appropriately oriented.  No asterixis.   Lab Results:  [facility] 08/29/11 0555  WBC 4.8  HGB 11.3*  HCT 32.0*  PLT 158   BMET  Basename 08/31/11 0525  NA 136  K 3.5  CL 104  CO2 26  GLUCOSE 118*  BUN 11  CREATININE 0.59  CALCIUM 8.2*   LFT  Basename 08/31/11 0525 08/30/11 0610 08/29/11 0555  PROT 7.6 7.8 7.6  ALBUMIN 2.7* 2.8* 2.7*  AST 680* 1024* 1248*  ALT 639* 802* 889*  ALKPHOS 124* 114 119*  BILITOT 8.3* 10.6* 11.2*  BILIDIR -- 6.7* 7.3*  IBILI -- 3.9* 3.9*    Pathology Diagnosis Liver, needle/core biopsy, Right - MARKED HEPATITIS WITH CHOLESTASIS AND INCREASED IRON. - SEE MICROSCOPIC DESCRIPTION. Microscopic Comment There are diffuse, mixed inflammatory lobular infiltrates including plasma cells. There are associated regenerative and degenerative changes within the hepatocytes, including ballooning degeneration. There is also hepatocyte and canalicular cholestasis present. Iron stain shows increased iron within Kupffer cells and within hepatocytes. The portal areas show variably increased inflammation, including plasma cells. Trichrome and reticulin stains do not show advanced fibrosis, and no alpha-1 antitrypsin deposits are identified with PAS stain. The differential includes acute  hepatitis with a cholestatic pattern, and possible etiologies would include acute onset autoimmune hepatitis as well as acute viral hepatitis. Clinical and serologic correlation are essential. (JDP:eps 08/30/11) Jimmy Picket MD Pathologist, Electronic Signature  ASSESMENT: * Hepatitis, acute. LFTs and jaundice continue to improve. Elevated IgG and IgM, Ferritin, smooth muscle Ab positive. 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  Day 3 po Prednisone.  * Language barrier in pt and family   PLAN: Discharge patient home today on 40 mg of prednisone.    LOS: 5 days   [name]  08/31/2011, 8:44 AM Pager: [phone]

## 2011-08-31 NOTE — Discharge Summary (Signed)
[facility] Discharge Summary  Name: Michael West MRN: [id number] DOB: Apr 10, 1970 41 y.o. PCP:  Michael Amsterdam, MD  Date of Admission: 08/26/2011 10:46 AM Date of Discharge: 08/31/2011 Attending Physician: Michael Fiedler, MD and Michael West [facility]  Admitting Dignosis: * Jaundice, acute hepatitis   Past Medical History  Diagnosis Date  . Rectal pain   . Hepatitis    Past Surgical History  Procedure Date  . Proctoscopy 12/06/2000    Internal and external hemorrhoidectomy  . Hemorrhoid surgery 12/06/2000     Discharge Diagnosis: 1.  Acute cholestatic hepatitis with jaundice of undetermined cause.  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 2.  Language barrier.  pt and his family speak only [place]   Consultations:  none  Procedures Performed: ultrasound guided liver biopsy   Imaging US Abdomen Complete 08/17/2011  COMPLETE ABDOMINAL ULTRASOUND .  IMPRESSION:  1.  Thickened gallbladder wall may be due to hypoproteinemia.  No gallstones are seen and there is no pain over the gallbladder with compression. 2.  The tail of the pancreas is obscured by bowel gas. 3.  No intrahepatic ductal dilatation is seen.  Original Report Authenticated By: Michael West, M.D.   Ct Abdomen Pelvis W Contrast 08/26/2011  CT ABDOMEN AND PELVIS WITH CONTRAST    IMPRESSION: No acute findings or other significant abnormality.  Original Report Authenticated By: Michael West, M.D.   US Biopsy 08/29/2011  ULTRASOUND-GUIDED RANDOM CORE LIVER BIOPSY:    Impression: 1. Technically successful ultrasound guided random liver core biopsy with moderate sedation as described above. 2.  Indeterminate approximately 1.3 cm hypoechoic lesion within the dome of the right lobe the liver, not definitely seen on preprocedural abdominal CT.  Further evaluation of this lesion may be performed with a contrast enhanced abdominal MRI as clinically indicated.  Read by: Michael West, P.A.-C  Original Report  Authenticated By: Michael West, M.D.     Brief History: Michael West is a 41 y.o. male who is [place] and does not speak any [place]. Seen in GI office 3 times in one week, referred by Michael West from primary care for an acute hepatitis. He became ill around July 4 and was progressively worse after that.  At onset he had epigastric discomfort, fever and anorexia. Fatigue made him unable to work . Upper abdominal ultrasound was done on 08/17/2011 and showed a thickened gallbladder wall felt possibly due to hypoproteinemia cobbler although 7.6 mm there were no gallstones and no ductal dilation, the liver appeared normal at that time pancreas was not seen.   Upper abdominal ultrasound was done on 08/17/2011 and showed a thickened gallbladder wall felt possibly due to hypoproteinemia.  CBD was 7.6 mm there were no gallstones and no ductal dilation.  Liver appeared normal and pancreas was not seen.   Acute hepatitis A, B, and C serologies were negative, serologies for CMV and EBV were pending, Monospot was negative. ANA was positive but titer was negative.   Decision was made to admit him for testing and supportive care on 08/26/11. Patient and his family's  lack of any English speaking were a significant issue.    Hospital Course by problem list: 1.  Acute cholestatic hepatitis with jaundice.  Pt had additional labs drawn and underwent liver biopsy.  The labs suggest acute autoimmune hepatitis.  The liver biopsy was not strongly confirmatory of this dx, but autoimmune hepatitis was in the differential.  He started 40 mg of Prednisone daily on  7/22. During hospital stay his LFTs and jaundice all improved.  Labs showed a progressive trend towards normal. Patient's appetite improved and he was not having significant abdominal pain or nausea.  He never had any coagulopathy.   Long discussions with family and translator were challenging.  The family kept asking what caused the hepatitis.   They were advised that it was probably autoimmune hepatitis and that he would continue on 40 mg mg prednisone daily. He has follow up office visit with GI, Dr Michael West, on 7/31 and labs 7/30.  The family prefers to deal with MD.  Discharge Vitals:  BP 100/65  Pulse 68  Temp 97.8 F (36.6 C) (Oral)  Resp 18  Ht 5\' 7"  (1.702 m)  Wt 160 lb 1.6 oz (72.621 kg)  BMI 25.08 kg/m2  SpO2 97%  Discharge Labs:  Results for orders placed during the hospital encounter of 08/26/11 (from the past 24 hour(s))  COMPREHENSIVE METABOLIC PANEL     Status: Abnormal   Collection Time   08/31/11  5:25 AM      Component Value Range   Sodium 136  135 - 145 mEq/L   Potassium 3.5  3.5 - 5.1 mEq/L   Chloride 104  96 - 112 mEq/L   CO2 26  19 - 32 mEq/L   Glucose, Bld 118 (*) 70 - 99 mg/dL   BUN 11  6 - 23 mg/dL   Creatinine, Ser 1.61  0.50 - 1.35 mg/dL   Calcium 8.2 (*) 8.4 - 10.5 mg/dL   Total Protein 7.6  6.0 - 8.3 g/dL   Albumin 2.7 (*) 3.5 - 5.2 g/dL   AST 096 (*) 0 - 37 U/L   ALT 639 (*) 0 - 53 U/L   Alkaline Phosphatase 124 (*) 39 - 117 U/L   Total Bilirubin 8.3 (*) 0.3 - 1.2 mg/dL   GFR calc non Af Amer >90  >90 mL/min   GFR calc Af Amer >90  >90 mL/min   Surgical Pathology Liver biopsy There are diffuse, mixed inflammatory lobular infiltrates including plasma cells. There are associated  regenerative and degenerative changes within the hepatocytes, including ballooning degeneration. There is  also hepatocyte and canalicular cholestasis present. Iron stain shows increased iron within Kupffer cells and  within hepatocytes. The portal areas show variably increased inflammation, including plasma cells.  Trichrome and reticulin stains do not show advanced fibrosis, and no alpha-1 antitrypsin deposits are  identified with PAS stain. The differential includes acute hepatitis with a cholestatic pattern, and possible  etiologies would include acute onset autoimmune hepatitis as well as acute viral  hepatitis. Clinical and  serologic correlation are essential.     Disposition and follow-up:   Mr.Michael West was discharged from  in  condition.    Diet at Discharge: regular   Follow-up Appointments: Discharge Orders    Future Appointments: Provider: Department: Dept Phone: Center:   09/06/2011 8:30 AM [facility] [phone] None   09/07/2011 8:30 AM Iva Boop, MD [facility] [place] [facility] [phone] [facility]     Discharge Medications: Medication List  As of 08/31/2011  1:54 PM   TAKE these medications         HYDROcodone-acetaminophen 5-325 MG per tablet   Commonly known as: NORCO/VICODIN   Take 1 tablet by mouth every 6 (six) hours as needed.      predniSONE 20 MG tablet   Commonly known as: DELTASONE   Take 2 tablets (40 mg total) by mouth  daily before breakfast.            Time Spent on discharge: 45 minutes   Signed: Kasandra Knudsen  [phone] 08/31/2011, 1:54 PM

## 2011-08-31 NOTE — Progress Notes (Signed)
Liver biopsy reviewed.  Findings are not specific for autoimmune hepatitis.  I am more inclined to believe this is an acute cholestatic hepatitis, etiology not yet determined.  AMA is still pending.  Viral markers are negative.  Recommend - d/c home on tapering prednisone over the next 3 weeks. GI f/u in 2-3 weeks

## 2011-08-31 NOTE — Progress Notes (Signed)
Patient discharged to home. Patient AVS reviewed with translator present to the room. Patient capable of verbalizing medications and follow-up appointments.  Patient remains stable; no signs or symptoms of distress.  Educated to return to the ER in cases of SOB, dizziness, fever, chest pain, or fainting.

## 2011-09-01 ENCOUNTER — Telehealth: Payer: Self-pay | Admitting: Internal Medicine

## 2011-09-01 MED ORDER — PREDNISONE 20 MG PO TABS: 40.0000 mg | ORAL_TABLET | Freq: Every day | ORAL | Status: DC

## 2011-09-01 NOTE — Telephone Encounter (Signed)
Sent prednisone to pt's pharmacy.

## 2011-09-01 NOTE — Telephone Encounter (Signed)
I called [facility] [address] and the pt picked up his prescription for the Hydrocodone yesterday, 08-31-2011.  Stacy Howald CMA called in the prednisone today.  The pharmacy has that med for the pt to pick up.

## 2011-09-01 NOTE — Progress Notes (Signed)
I have personally taken an interval history, reviewed the chart, and examined the patient.  I agree with the extender's note, impression and recommendations. Per my note he may have a cholestatic hepatitis.  Autoimmune hepatitis is less likely.  Immediate treatment is the same.

## 2011-09-01 NOTE — Telephone Encounter (Signed)
I called [employer] and the [place] interpreter called the pt to advist that the Prednisone  prescription is at [facility], corner of [address]. The patient said he will go and pick it up and said thank you.

## 2011-09-05 NOTE — Discharge Summary (Signed)
Diagnosis at this point is autoimmune hepatitis.  Patient on steroids and has appropriate follow-up with our office later this week.

## 2011-09-06 ENCOUNTER — Other Ambulatory Visit (INDEPENDENT_AMBULATORY_CARE_PROVIDER_SITE_OTHER): Payer: 59

## 2011-09-06 ENCOUNTER — Other Ambulatory Visit: Payer: Self-pay

## 2011-09-06 DIAGNOSIS — K759 Inflammatory liver disease, unspecified: Secondary | ICD-10-CM

## 2011-09-06 LAB — COMPREHENSIVE METABOLIC PANEL
AST: 301 U/L — ABNORMAL HIGH (ref 0–37)
Albumin: 3 g/dL — ABNORMAL LOW (ref 3.5–5.2)
Alkaline Phosphatase: 115 U/L (ref 39–117)
Potassium: 4.2 mEq/L (ref 3.5–5.1)
Sodium: 134 mEq/L — ABNORMAL LOW (ref 135–145)
Total Protein: 7.6 g/dL (ref 6.0–8.3)

## 2011-09-07 ENCOUNTER — Telehealth: Payer: Self-pay | Admitting: Internal Medicine

## 2011-09-07 ENCOUNTER — Encounter: Payer: Self-pay | Admitting: Internal Medicine

## 2011-09-07 ENCOUNTER — Ambulatory Visit (INDEPENDENT_AMBULATORY_CARE_PROVIDER_SITE_OTHER): Payer: 59 | Admitting: Internal Medicine

## 2011-09-07 ENCOUNTER — Ambulatory Visit (INDEPENDENT_AMBULATORY_CARE_PROVIDER_SITE_OTHER): Payer: 59 | Admitting: Physician Assistant

## 2011-09-07 VITALS — BP 134/76 | HR 69 | Temp 98.0°F | Resp 17 | Ht 68.0 in | Wt 166.0 lb

## 2011-09-07 VITALS — BP 108/80 | HR 72 | Ht 67.0 in | Wt 167.2 lb

## 2011-09-07 DIAGNOSIS — Z23 Encounter for immunization: Secondary | ICD-10-CM

## 2011-09-07 DIAGNOSIS — K754 Autoimmune hepatitis: Secondary | ICD-10-CM

## 2011-09-07 DIAGNOSIS — K5900 Constipation, unspecified: Secondary | ICD-10-CM

## 2011-09-07 DIAGNOSIS — G4700 Insomnia, unspecified: Secondary | ICD-10-CM

## 2011-09-07 MED ORDER — ZOLPIDEM TARTRATE 5 MG PO TABS: 5.0000 mg | ORAL_TABLET | Freq: Every evening | ORAL | Status: DC | PRN

## 2011-09-07 NOTE — Progress Notes (Signed)
  Subjective:    Patient ID: Michael West, male    DOB: February 21, 1970, 41 y.o.   MRN: [id number]  HPI The patient presents in followup after a hospitalization for an acute hepatitis with a diagnosis of autoimmune hepatitis. He is here with his wife and an interpreter. He was started on prednisone 40 mg daily. He feels a little better but still is weak and tired and is having insomnia. Appetite is increased also. Not yet able to return to work. He is also having some difficulty moving his bowels though milk of magnesia helps. He was concerned about taking that on a daily basis.  Medications, allergies, past medical history, past surgical history, family history and social history are reviewed and updated in the EMR.  Review of Systems As per history of present illness. He is asking about obtaining a tetanus vaccine    Objective:   Physical Exam General:  NAD Eyes:   icteric Lungs:  clear Heart:  S1S2 no rubs, murmurs or gallops Abdomen:  soft and nontender, BS+ I cannot detect any hepatosplenomegaly Ext:   no edema    Data Reviewed:  Lab Results  Component Value Date   ALT 413* 09/06/2011   AST 301* 09/06/2011   ALKPHOS 115 09/06/2011   BILITOT 4.9* 09/06/2011        Assessment & Plan:   1. Autoimmune hepatitis   Improved. Continue prednisone 40 mg daily. Recheck LFTs just before a return visit in 3 weeks. Remain out of work until that time.   It is reasonable to have a Tdap and the pneumonia vaccine makes sense as well. I will look into his other serologies and make other recommendations. He will go to primary care to get these 2 vaccinations today.   2. Constipation   Add a daily stool softener and use milk of magnesia as needed.   3. Insomnia   This seems likely to be from the prednisone. Ambien 5 mg nightly as needed as prescribed #30 no refills.    I appreciate the opportunity to care for this patient.  CC: Abbe Amsterdam, MD

## 2011-09-07 NOTE — Patient Instructions (Addendum)
Dr. Leone Payor needs to see you back in 3 weeks.  You will remain out of work until then.  We have given you a note today for your employer.   Take a stool softner every day.  This is over the counter.   And it is ok to use Milk of Magnesia. We have given you the following medications to fill at your pharmacy at your convenience: Ambien  You can get your TDAP at the [facility]  The week of your appointment come in a day or so ahead and go to the basement and have labs drawn.   Please read the information on Autoimmune Hepatitis provided to you today.  Thank you for choosing me and [facility].  Iva Boop, M.D., [facility]

## 2011-09-07 NOTE — Progress Notes (Signed)
  Subjective:    Patient ID: Michael West, male    DOB: 05/01/1970, 41 y.o.   MRN: [id number]  HPI This 41 y.o. Male presents for Tdap and pneumococcal vaccine recommended by Dr. Leone Payor.  He was recently hospitalized with hepatitis, possibly autoimmune.  A translator is present to help [place] communicate.    Past Medical History  Diagnosis Date  . Rectal pain   . Hepatitis     Past Surgical History  Procedure Date  . Proctoscopy 12/06/2000    Internal and external hemorrhoidectomy  . Hemorrhoid surgery 12/06/2000    Prior to Admission medications   Medication Sig Start Date End Date Taking? Authorizing Provider  predniSONE (DELTASONE) 20 MG tablet Take 2 tablets (40 mg total) by mouth daily before breakfast. 09/01/11 09/11/11 Yes Beverley Fiedler, MD  zolpidem (AMBIEN) 5 MG tablet Take 1 tablet (5 mg total) by mouth at bedtime as needed for sleep. 09/07/11 10/07/11 Yes Iva Boop, MD  HYDROcodone-acetaminophen (NORCO/VICODIN) 5-325 MG per tablet  08/31/11   Historical Provider, MD    No Known Allergies  History   Social History  . Marital Status: Married    Spouse Name: Tristan Schroeder    Number of Children: 0  . Years of Education: 3   Occupational History  .  [employer]   Social History Main Topics  . Smoking status: Never Smoker   . Smokeless tobacco: Never Used  . Alcohol Use: 1.2 oz/week    2 Cans of beer per week     once a week  . Drug Use: No  . Sexually Active: Not Currently   Social History Narrative   Moved to the [place] from [place] in 11/1996.    Family History  Problem Relation Age of Onset  . Colon cancer Mother    Review of Systems     Objective:   Physical Exam  Blood pressure 134/76, pulse 69, temperature 98 F (36.7 C), temperature source Oral, resp. rate 17, height 5\' 8"  (1.727 m), weight 166 lb (75.297 kg), SpO2 99.00%. Body mass index is 25.24 kg/(m^2). Well-developed, well nourished [place] man who is awake, alert and oriented, in NAD. HEENT:  [place]/AT, sclera and conjunctiva are clear.   Lungs: normal effort Skin: warm and dry.     Assessment & Plan:   1. Need for Tdap vaccination  Tdap vaccine greater than or equal to 7yo IM  2. Need for pneumococcal vaccination  Pneumococcal polysaccharide vaccine 23-valent greater than or equal to 2yo subcutaneous/IM   Follow-up with Dr. Leone Payor as planned.

## 2011-09-08 ENCOUNTER — Other Ambulatory Visit (INDEPENDENT_AMBULATORY_CARE_PROVIDER_SITE_OTHER): Payer: 59

## 2011-09-08 ENCOUNTER — Other Ambulatory Visit: Payer: Self-pay

## 2011-09-08 ENCOUNTER — Telehealth: Payer: Self-pay

## 2011-09-08 DIAGNOSIS — R739 Hyperglycemia, unspecified: Secondary | ICD-10-CM

## 2011-09-08 DIAGNOSIS — K754 Autoimmune hepatitis: Secondary | ICD-10-CM

## 2011-09-08 LAB — CBC WITH DIFFERENTIAL/PLATELET
Basophils Relative: 0.3 % (ref 0.0–3.0)
Eosinophils Absolute: 0.1 10*3/uL (ref 0.0–0.7)
HCT: 35.1 % — ABNORMAL LOW (ref 39.0–52.0)
Lymphs Abs: 4.4 10*3/uL — ABNORMAL HIGH (ref 0.7–4.0)
MCHC: 33.9 g/dL (ref 30.0–36.0)
MCV: 89.4 fl (ref 78.0–100.0)
Monocytes Absolute: 0.9 10*3/uL (ref 0.1–1.0)
Neutrophils Relative %: 45.8 % (ref 43.0–77.0)
Platelets: 215 10*3/uL (ref 150.0–400.0)

## 2011-09-08 LAB — COMPREHENSIVE METABOLIC PANEL
AST: 288 U/L — ABNORMAL HIGH (ref 0–37)
Albumin: 3.4 g/dL — ABNORMAL LOW (ref 3.5–5.2)
Alkaline Phosphatase: 94 U/L (ref 39–117)
BUN: 15 mg/dL (ref 6–23)
Potassium: 3.9 mEq/L (ref 3.5–5.1)
Sodium: 136 mEq/L (ref 135–145)
Total Bilirubin: 5.1 mg/dL — ABNORMAL HIGH (ref 0.3–1.2)

## 2011-09-08 MED ORDER — PREDNISONE 20 MG PO TABS: ORAL_TABLET | ORAL | Status: DC

## 2011-09-08 MED ORDER — PREDNISONE 10 MG PO TABS: ORAL_TABLET | ORAL | Status: DC

## 2011-09-08 NOTE — Telephone Encounter (Signed)
I spoke with the [facility] pharmacist.  The patient was confused about the medication instructions from yesterday's office visit and they refused to pick up the Ambien rx after it was filled and demanded back the original paper rx.  I spoke with the patient today in the office with a male interpreter that accompanied him.  I explained that he needed to pick up rx for prednisone and get the Ambien rx filled.  They expressed understanding and the importance of keeping the follow up appt.

## 2011-09-08 NOTE — Telephone Encounter (Signed)
Message copied by Annett Fabian on Thu Sep 08, 2011  9:16 AM ------      Message from: Stan Head E      Created: Wed Sep 07, 2011  6:46 PM      Regarding: FW: meds       We gave them a printed rx for Ambien or should have            Also - I did not notice earlier but blood sugar is up on the prednisone             Will need to have him get a CMET earlier than planned - next week please      ----- Message -----         From: Hilarie Fredrickson, MD         Sent: 09/07/2011   6:04 PM           To: Rossie Muskrat, RN,CGRN, Iva Boop, MD      Subject: meds                                                     Patient wife called re meds not at pharmacy. Not sure what med (pleasant, but poor [place]), she spelled prednisone. Saw that he has autoimmune hepatitis. Saw an open phone note from earlier today re the same (? Ambien). I Told them to call first thing in am to clarify. Probable should ask your nurse to call and clarify. Thanks. Lavonna Rua, FYI:  I tried to create a new phone note, but, for some reason, I was rpeatedly put into the open note from earlier today...was I doing something incorrect?)

## 2011-09-08 NOTE — Telephone Encounter (Signed)
Patient has spoken with Patti [place], CMA.  He will bring the paperwork that was provided and the prescription to the office today.  He stated that the rx was not signed by the MD.  He will also be asked to go to the lab today for CMET

## 2011-09-08 NOTE — Progress Notes (Signed)
Quick Note:  Blood sugar better  Other labs same or better  Needs repeat CMET several days to 1 week before follow-up visit. ______

## 2011-09-09 ENCOUNTER — Other Ambulatory Visit: Payer: Self-pay

## 2011-09-09 DIAGNOSIS — K759 Inflammatory liver disease, unspecified: Secondary | ICD-10-CM

## 2011-09-12 ENCOUNTER — Other Ambulatory Visit (INDEPENDENT_AMBULATORY_CARE_PROVIDER_SITE_OTHER): Payer: 59

## 2011-09-12 DIAGNOSIS — K759 Inflammatory liver disease, unspecified: Secondary | ICD-10-CM

## 2011-09-12 LAB — COMPREHENSIVE METABOLIC PANEL
AST: 168 U/L — ABNORMAL HIGH (ref 0–37)
Albumin: 3.4 g/dL — ABNORMAL LOW (ref 3.5–5.2)
Alkaline Phosphatase: 105 U/L (ref 39–117)
BUN: 11 mg/dL (ref 6–23)
Potassium: 4.3 mEq/L (ref 3.5–5.1)
Total Bilirubin: 4 mg/dL — ABNORMAL HIGH (ref 0.3–1.2)

## 2011-09-12 NOTE — Progress Notes (Signed)
Quick Note:  Labs better, reduce prednisone to 30 mg daily next Monday ______

## 2011-09-23 ENCOUNTER — Other Ambulatory Visit (INDEPENDENT_AMBULATORY_CARE_PROVIDER_SITE_OTHER): Payer: 59

## 2011-09-23 DIAGNOSIS — R7309 Other abnormal glucose: Secondary | ICD-10-CM

## 2011-09-23 DIAGNOSIS — R739 Hyperglycemia, unspecified: Secondary | ICD-10-CM

## 2011-09-23 LAB — COMPREHENSIVE METABOLIC PANEL
Albumin: 3.8 g/dL (ref 3.5–5.2)
Alkaline Phosphatase: 95 U/L (ref 39–117)
BUN: 12 mg/dL (ref 6–23)
CO2: 27 mEq/L (ref 19–32)
Calcium: 9.1 mg/dL (ref 8.4–10.5)
Chloride: 100 mEq/L (ref 96–112)
GFR: 124.54 mL/min (ref >60.00)
Glucose, Bld: 134 mg/dL — ABNORMAL HIGH (ref 70–99)
Potassium: 4.1 mEq/L (ref 3.5–5.1)
Sodium: 135 mEq/L (ref 135–145)
Total Protein: 8.1 g/dL (ref 6.0–8.3)

## 2011-09-26 NOTE — Progress Notes (Signed)
Quick Note:  Labs better but still abnormal Will discuss further at 8/23 appt ______

## 2011-09-27 ENCOUNTER — Other Ambulatory Visit: Payer: Self-pay

## 2011-09-27 ENCOUNTER — Other Ambulatory Visit (INDEPENDENT_AMBULATORY_CARE_PROVIDER_SITE_OTHER): Payer: 59

## 2011-09-27 DIAGNOSIS — R7989 Other specified abnormal findings of blood chemistry: Secondary | ICD-10-CM

## 2011-09-27 LAB — HEPATIC FUNCTION PANEL
Bilirubin, Direct: 0.9 mg/dL — ABNORMAL HIGH (ref 0.0–0.3)
Total Bilirubin: 2.2 mg/dL — ABNORMAL HIGH (ref 0.3–1.2)

## 2011-09-30 ENCOUNTER — Encounter: Payer: Self-pay | Admitting: Internal Medicine

## 2011-09-30 ENCOUNTER — Ambulatory Visit (INDEPENDENT_AMBULATORY_CARE_PROVIDER_SITE_OTHER): Payer: 59 | Admitting: Internal Medicine

## 2011-09-30 VITALS — BP 110/70 | HR 88 | Ht 67.75 in | Wt 171.1 lb

## 2011-09-30 DIAGNOSIS — K754 Autoimmune hepatitis: Secondary | ICD-10-CM

## 2011-09-30 MED ORDER — PREDNISONE 10 MG PO TABS: 30.0000 mg | ORAL_TABLET | Freq: Every day | ORAL | Status: DC

## 2011-09-30 NOTE — Progress Notes (Signed)
  Subjective:    Patient ID: Michael West, male    DOB: 11/02/1970, 41 y.o.   MRN: [id number]  HPI The patient returns for followup of autoimmune hepatitis. He has been treated with prednisone and is currently on 40 mg daily. I have been tracking his liver chemistries and they are improved. He reports that other than some cramps in his fingers and hand in his feet he is much much better with a good energy level. He feels like he is ready to go back to work though is somewhat uncertain as to the cramps. He is moving his bowels well and does not have insomnia like he did before.  Wt Readings from Last 3 Encounters:  09/30/11 171 lb 2 oz (77.622 kg)  09/07/11 166 lb (75.297 kg)  09/07/11 167 lb 3.2 oz (75.841 kg)    History is taken using an interpreter  Medications, allergies, past medical history, past surgical history, family history and social history are reviewed and updated in the EMR.   Review of Systems As above    Objective:   Physical Exam General:  NAD Eyes:   anicteric Lungs:  clear Heart:  S1S2 no rubs, murmurs or gallops Abdomen:  soft and nontender, BS+ Ext:   no edema    Data Reviewed:    Chemistry      Component Value Date/Time   NA 135 09/23/2011 1137   K 4.1 09/23/2011 1137   CL 100 09/23/2011 1137   CO2 27 09/23/2011 1137   BUN 12 09/23/2011 1137   CREATININE 0.7 09/23/2011 1137   CREATININE 0.72 08/17/2011 0911      Component Value Date/Time   CALCIUM 9.1 09/23/2011 1137   ALKPHOS 94 09/27/2011 1109   AST 160* 09/27/2011 1109   ALT 194* 09/27/2011 1109   BILITOT 2.2* 09/27/2011 1109             Assessment & Plan:   1. Autoimmune hepatitis    This is improving.  1. Reduce prednisone to 30 mg daily 2. Repeat liver chemistries in approximately one month with off his visit soon after 3. Return to work 10/03/2011 4. I do not know if he is immune to hepatitis B or a somewhat we will test for that  CC: COPLAND,JESSICA, MD

## 2011-09-30 NOTE — Patient Instructions (Addendum)
We have made you an appointment to come back and see Dr. Leone Payor on 10/28/11 at 11:00am, please arrive at 10:45am to get checked in.  Several days prior to your appointment come and go to the lab in the basement to have lab work drawn.  No appointment needed.  They are open 7:30am - 5:30pm Mon- Friday.     Take 30mg  of Prednisone every day.  Do not run out of this medicine.  You have a refill on this.  You have been given a work note to return to work Monday 10/03/11.   Thank you for choosing me and [facility].  Iva Boop, M.D., [facility]

## 2011-10-04 NOTE — Telephone Encounter (Signed)
Already answered  

## 2011-10-19 ENCOUNTER — Other Ambulatory Visit (INDEPENDENT_AMBULATORY_CARE_PROVIDER_SITE_OTHER): Payer: 59

## 2011-10-19 DIAGNOSIS — K754 Autoimmune hepatitis: Secondary | ICD-10-CM

## 2011-10-20 LAB — COMPREHENSIVE METABOLIC PANEL
Albumin: 3.5 g/dL (ref 3.5–5.2)
Alkaline Phosphatase: 107 U/L (ref 39–117)
BUN: 13 mg/dL (ref 6–23)
Calcium: 8.9 mg/dL (ref 8.4–10.5)
Chloride: 103 mEq/L (ref 96–112)
Glucose, Bld: 147 mg/dL — ABNORMAL HIGH (ref 70–99)
Potassium: 4.7 mEq/L (ref 3.5–5.1)
Sodium: 138 mEq/L (ref 135–145)
Total Protein: 7.6 g/dL (ref 6.0–8.3)

## 2011-10-20 NOTE — Progress Notes (Signed)
Quick Note:  LFT's somewhat worse Needs to go back to 40 mg prednisone daily  Recheck LFT's day before office visit ______

## 2011-10-25 ENCOUNTER — Encounter: Payer: Self-pay | Admitting: Internal Medicine

## 2011-10-28 ENCOUNTER — Ambulatory Visit (INDEPENDENT_AMBULATORY_CARE_PROVIDER_SITE_OTHER): Payer: 59 | Admitting: Internal Medicine

## 2011-10-28 ENCOUNTER — Other Ambulatory Visit (INDEPENDENT_AMBULATORY_CARE_PROVIDER_SITE_OTHER): Payer: 59

## 2011-10-28 ENCOUNTER — Other Ambulatory Visit: Payer: Self-pay | Admitting: Internal Medicine

## 2011-10-28 ENCOUNTER — Encounter: Payer: Self-pay | Admitting: Internal Medicine

## 2011-10-28 ENCOUNTER — Telehealth: Payer: Self-pay

## 2011-10-28 VITALS — BP 110/70 | HR 88 | Ht 67.75 in | Wt 178.6 lb

## 2011-10-28 DIAGNOSIS — K754 Autoimmune hepatitis: Secondary | ICD-10-CM

## 2011-10-28 DIAGNOSIS — Z79899 Other long term (current) drug therapy: Secondary | ICD-10-CM

## 2011-10-28 LAB — CBC WITH DIFFERENTIAL/PLATELET
Basophils Absolute: 0 10*3/uL (ref 0.0–0.1)
Basophils Relative: 0.3 % (ref 0.0–3.0)
Eosinophils Relative: 0 % (ref 0.0–5.0)
HCT: 42.2 % (ref 39.0–52.0)
Hemoglobin: 13.9 g/dL (ref 13.0–17.0)
Lymphocytes Relative: 19.4 % (ref 12.0–46.0)
Lymphs Abs: 0.9 10*3/uL (ref 0.7–4.0)
Monocytes Relative: 3.5 % (ref 3.0–12.0)
Neutro Abs: 3.7 10*3/uL (ref 1.4–7.7)
RBC: 4.16 Mil/uL — ABNORMAL LOW (ref 4.22–5.81)
RDW: 15.3 % — ABNORMAL HIGH (ref 11.5–14.6)
WBC: 4.9 10*3/uL (ref 4.5–10.5)

## 2011-10-28 LAB — COMPREHENSIVE METABOLIC PANEL
ALT: 339 U/L — ABNORMAL HIGH (ref 0–53)
CO2: 28 mEq/L (ref 19–32)
Calcium: 8.9 mg/dL (ref 8.4–10.5)
Chloride: 102 mEq/L (ref 96–112)
Creatinine, Ser: 0.9 mg/dL (ref 0.4–1.5)
GFR: 104.43 mL/min (ref >60.00)
Glucose, Bld: 232 mg/dL — ABNORMAL HIGH (ref 70–99)
Total Bilirubin: 1.1 mg/dL (ref 0.3–1.2)

## 2011-10-28 MED ORDER — PREDNISONE 10 MG PO TABS: 30.0000 mg | ORAL_TABLET | Freq: Every day | ORAL | Status: DC

## 2011-10-28 NOTE — Progress Notes (Signed)
  Subjective:    Patient ID: Michael West, male    DOB: [birth year], 41 y.o.   MRN: [id number]  HPI patient returns in followup with the interpreter, he has a diagnosis of autoimmune hepatitis and has been on prednisone. He was improving and as does her is reduced from 40 mg daily to 30 mg daily a month ago. His bilirubin has declined his transaminases have increased some. She numbers below. He feels well otherwise has no complaints, he is back to work. He did run out of his prednisone 2 days ago.  Medications, allergies, past medical history, past surgical history, family history and social history are reviewed and updated in the EMR.   Review of Systems As above    Objective:   Physical Exam General:  NAD - cushingoid [place] Eyes:   anicteric Lungs:  clear Heart:  S1S2 no rubs, murmurs or gallops Abdomen:  soft and nontender, BS+ Ext:   no edema    Data Reviewed:     Chemistry      Component Value Date/Time   NA 138 10/19/2011 1256   K 4.7 10/19/2011 1256   CL 103 10/19/2011 1256   CO2 27 10/19/2011 1256   BUN 13 10/19/2011 1256   CREATININE 0.9 10/19/2011 1256   CREATININE 0.72 08/17/2011 0911      Component Value Date/Time   CALCIUM 8.9 10/19/2011 1256   ALKPHOS 107 10/19/2011 1256   AST 292* 10/19/2011 1256   ALT 318* 10/19/2011 1256   BILITOT 1.3* 10/19/2011 1256        Assessment & Plan:   1. Autoimmune hepatitis treated with steroids   2. Long-term use of immunosuppressant medication        1. Clinically  he is improved (symptoms)and has been for a while but his LFTs are worse. He is now Cushingoid 2. Recheck LFTs as well as his electrolytes today and a CBC and get a TPMT phenotype in anticipation of azathioprine therapy. 3. May need to increase his prednisone again. 4. Followup with me next Tuesday.  5. I have explained the risks benefits and indications of his immunosuppressive therapy including a steroids and the addition of azathioprine as best I can do the  interpreter. I've explained the need for regular blood counts, the possibility of marrow suppression, allergic reactions an increased risk of infection and rare possibility of blood dyscrasias that could be in the leukemia family or lymphoma family. However he has ongoing liver inflammation based upon the available information and needs treatment. So I do think the benefits outweigh the risks. 6. I explained, through the interpreter that he should not run out of and stop his prednisone without my direction 7. He needs an influenza vaccine when he returns or at his primary care office  CC: Abbe Amsterdam, MD

## 2011-10-28 NOTE — Patient Instructions (Addendum)
Your physician has requested that you go to the basement for the following lab work before leaving today:  CBC, CMET, TPMT  Dr. Leone Payor would like you to follow up with him on Tuesday 11-01-11 at 2:15.

## 2011-10-28 NOTE — Telephone Encounter (Signed)
Pt's translator called saying the Prednisone Dr. Leone Payor refilled at pt's appointment earlier today was not at the pharmacy.  I called [facility] [address] and gave them the RX verbally.  I called pt's translator back and told him they were getting rx for Prednisone ready right now.  He acknowledged and will go pick up

## 2011-10-31 LAB — PROMETHEUS-MAIL

## 2011-11-01 ENCOUNTER — Ambulatory Visit (INDEPENDENT_AMBULATORY_CARE_PROVIDER_SITE_OTHER): Payer: 59 | Admitting: Internal Medicine

## 2011-11-01 ENCOUNTER — Encounter: Payer: Self-pay | Admitting: Internal Medicine

## 2011-11-01 VITALS — BP 112/68 | HR 72 | Ht 67.75 in | Wt 179.0 lb

## 2011-11-01 DIAGNOSIS — Z79899 Other long term (current) drug therapy: Secondary | ICD-10-CM

## 2011-11-01 DIAGNOSIS — Z23 Encounter for immunization: Secondary | ICD-10-CM

## 2011-11-01 DIAGNOSIS — K754 Autoimmune hepatitis: Secondary | ICD-10-CM

## 2011-11-01 MED ORDER — AZATHIOPRINE 50 MG PO TABS: 50.0000 mg | ORAL_TABLET | Freq: Every day | ORAL | Status: DC

## 2011-11-01 NOTE — Assessment & Plan Note (Signed)
Starting azathioprine 50 mg daily Repeat CBC and LFT 2 weeks and 4 weeks Office visit late October before he goes to [place] for 6 weeks Flu vaccine today

## 2011-11-01 NOTE — Patient Instructions (Addendum)
Please come back October 8th and go to the lab for labs to be drawn and also again on October 15th.  The lab is open 7:30am-5:30pm.  Today you have been given a flu vaccine for the 2013/2014 season.  Continue taking your prednisone at your current dose.   We have sent the following medications to your pharmacy for you to pick up at your convenience: Azathioprine  Dr. Leone Payor would like for you to come see [place] in late October before you travel.  Thank you for choosing me and [facility].  Iva Boop, M.D., [facility]

## 2011-11-01 NOTE — Progress Notes (Signed)
  Subjective:    Patient ID: Michael West, male    DOB: 1970/09/26, 41 y.o.   MRN: [id number]  HPI Patient presents with the interpreter for followup of autoimmune hepatitis. When last year, he was evaluated and had testing initiated for possible immunomodulator therapy azathioprine. He continues to feel okay. His liver chemistries remain abnormal and her overall stable, see below. Have not yet normalized. He remains on prednisone 30 mg daily.  He is planning to go to [place] for about 6 weeks, in November.  Medications, allergies, past medical history, past surgical history, family history and social history are reviewed and updated in the EMR.  Review of Systems As above    Objective:   Physical Exam Cushingoid Asian man in no acute distress He is not jaundiced      Assessment & Plan:   1. Autoimmune hepatitis   2. Long-term use of immunosuppressant medication   3. Need for immunization against influenza    1. Initiate azathioprine 50 mg daily. His TPMT enzyme asking is not yet back but I think it's reasonable start a low dose and I will review that result soon and make any adjustments needed. I have explained the risks benefits and indications of this medication including increased risk of infections, possibility of hematologic malignancies and the need for monitoring of blood counts and the possibility of idiosyncratic reactions including pancreatitis and hepatitis as best I can through the interpreter. This is a standard of care for autoimmune hepatitis and should help reduce his need for prednisone. 2. Repeat liver tests and CBC in 2 weeks and 4 weeks 3. See me in late October, prior to his trip home to [place]. He will need to have a supply of medications Lasix entire trip which will be about 6 weeks, and also supply some medical records to take. Hopefully will be improved enough to do this. He is already purchased his travel. 4. Influenza vaccine today  CC: COPLAND,JESSICA,  MD

## 2011-11-02 ENCOUNTER — Other Ambulatory Visit: Payer: Self-pay

## 2011-11-02 DIAGNOSIS — K754 Autoimmune hepatitis: Secondary | ICD-10-CM

## 2011-11-03 ENCOUNTER — Other Ambulatory Visit: Payer: Self-pay

## 2011-11-03 DIAGNOSIS — K754 Autoimmune hepatitis: Secondary | ICD-10-CM

## 2011-11-14 ENCOUNTER — Other Ambulatory Visit (INDEPENDENT_AMBULATORY_CARE_PROVIDER_SITE_OTHER): Payer: 59

## 2011-11-14 ENCOUNTER — Other Ambulatory Visit: Payer: Self-pay | Admitting: Internal Medicine

## 2011-11-14 DIAGNOSIS — K754 Autoimmune hepatitis: Secondary | ICD-10-CM

## 2011-11-14 LAB — CBC WITH DIFFERENTIAL/PLATELET
Basophils Absolute: 0 10*3/uL (ref 0.0–0.1)
Basophils Relative: 0.1 % (ref 0.0–3.0)
Hemoglobin: 14.5 g/dL (ref 13.0–17.0)
Lymphocytes Relative: 20.9 % (ref 12.0–46.0)
Monocytes Relative: 2.4 % — ABNORMAL LOW (ref 3.0–12.0)
Neutro Abs: 3.8 10*3/uL (ref 1.4–7.7)
RBC: 4.32 Mil/uL (ref 4.22–5.81)
RDW: 14.1 % (ref 11.5–14.6)

## 2011-11-14 LAB — HEPATIC FUNCTION PANEL
AST: 205 U/L — ABNORMAL HIGH (ref 0–37)
Albumin: 3.6 g/dL (ref 3.5–5.2)
Alkaline Phosphatase: 109 U/L (ref 39–117)
Bilirubin, Direct: 0.3 mg/dL (ref 0.0–0.3)

## 2011-11-14 LAB — HEPATITIS B SURFACE ANTIBODY,QUALITATIVE: Hep B S Ab: POSITIVE — AB

## 2011-11-15 LAB — HEPATITIS A ANTIBODY, TOTAL: Hep A Total Ab: POSITIVE — AB

## 2011-11-21 LAB — THIOPURINE METABOLITES: 6-MMPN Metaboilte: <175 pmol/8x 10E8

## 2011-11-21 NOTE — Progress Notes (Signed)
Quick Note:  I needed the TPMT phenotype test on him not the metatbolites ? If they still have blood to run or we need to redraw  ______

## 2011-11-25 LAB — SPECIMEN STATUS REPORT

## 2011-11-25 LAB — THIOPURINE METHYLTRANSFERASE (TPMT), RBC

## 2011-11-28 ENCOUNTER — Other Ambulatory Visit (INDEPENDENT_AMBULATORY_CARE_PROVIDER_SITE_OTHER): Payer: 59

## 2011-11-28 DIAGNOSIS — K754 Autoimmune hepatitis: Secondary | ICD-10-CM

## 2011-11-28 LAB — CBC WITH DIFFERENTIAL/PLATELET
Basophils Absolute: 0 10*3/uL (ref 0.0–0.1)
Basophils Relative: 0.4 % (ref 0.0–3.0)
Eosinophils Absolute: 0 10*3/uL (ref 0.0–0.7)
Hemoglobin: 14.1 g/dL (ref 13.0–17.0)
Lymphocytes Relative: 28.3 % (ref 12.0–46.0)
MCHC: 33.5 g/dL (ref 30.0–36.0)
Monocytes Relative: 7.9 % (ref 3.0–12.0)
Neutrophils Relative %: 63 % (ref 43.0–77.0)
RBC: 4.25 Mil/uL (ref 4.22–5.81)

## 2011-11-28 LAB — HEPATIC FUNCTION PANEL
AST: 123 U/L — ABNORMAL HIGH (ref 0–37)
Alkaline Phosphatase: 94 U/L (ref 39–117)
Bilirubin, Direct: 0.2 mg/dL (ref 0.0–0.3)
Total Protein: 7.4 g/dL (ref 6.0–8.3)

## 2011-12-06 ENCOUNTER — Encounter: Payer: Self-pay | Admitting: Internal Medicine

## 2011-12-06 ENCOUNTER — Ambulatory Visit (INDEPENDENT_AMBULATORY_CARE_PROVIDER_SITE_OTHER): Payer: 59 | Admitting: Internal Medicine

## 2011-12-06 VITALS — BP 100/70 | HR 64 | Ht 69.0 in | Wt 180.0 lb

## 2011-12-06 DIAGNOSIS — K754 Autoimmune hepatitis: Secondary | ICD-10-CM

## 2011-12-06 MED ORDER — PREDNISONE 10 MG PO TABS: 20.0000 mg | ORAL_TABLET | Freq: Every day | ORAL | Status: DC

## 2011-12-06 MED ORDER — AZATHIOPRINE 50 MG PO TABS: 50.0000 mg | ORAL_TABLET | Freq: Every day | ORAL | Status: DC

## 2011-12-06 NOTE — Assessment & Plan Note (Addendum)
Improving Will reduce prednisone to 20 mg daily Stay on azathioprine 50 mg daily (TPMT enzyme activity pending) See me in December after he returns from [place] Explained the need to avoid rapid discontinuation of his prednisone and 2 practice good hygiene and try to avoid infectious contacts while away and while here. Had given him copies of his lab tests, liver biopsy and explained in his AVS that he has autoimmune hepatitis. He was instructed to show this information to any health care provider he could need to see while traveling.

## 2011-12-06 NOTE — Patient Instructions (Addendum)
You have autoimmune hepatitis. Please show this information to a doctor if you need to see one when you are in [place]. I also gave you copies of the liver biopsy and labs. See me again in middle of December. Thank you for choosing me and [facility].  Iva Boop, MD, [facility] [email or web address]

## 2011-12-06 NOTE — Progress Notes (Signed)
  Subjective:    Patient ID: Michael West, male    DOB: 1970/02/15, 41 y.o.   MRN: [id number]  HPI The patient returns with the interpreter. He feels well. He is preparing to travel out of the country to [place] in about a week. He will be gone for 6 weeks. His transaminases are improving. He has tolerated 50 mg azathioprine daily along with 30 mg prednisone without difficulty. He is back to work.  Medications, allergies, past medical history, past surgical history, family history and social history are reviewed and updated in the EMR.  Review of Systems As above    Objective:   Physical Exam General:  NAD-mildly cushingoid Eyes:   anicteric Lungs:  clear Heart:  S1S2 no rubs, murmurs or gallops Abdomen:  soft and nontender, BS+ Ext:   no edema    Data Reviewed:  Lab Results  Component Value Date   ALT 146* 11/28/2011   AST 123* 11/28/2011   ALKPHOS 94 11/28/2011   BILITOT 0.7 11/28/2011          Assessment & Plan:   1. Autoimmune hepatitis

## 2012-02-03 ENCOUNTER — Ambulatory Visit: Payer: 59

## 2012-02-03 ENCOUNTER — Ambulatory Visit (INDEPENDENT_AMBULATORY_CARE_PROVIDER_SITE_OTHER): Payer: 59 | Admitting: Emergency Medicine

## 2012-02-03 VITALS — BP 108/72 | HR 87 | Temp 98.6°F | Resp 16 | Ht 68.0 in | Wt 185.4 lb

## 2012-02-03 DIAGNOSIS — M25579 Pain in unspecified ankle and joints of unspecified foot: Secondary | ICD-10-CM

## 2012-02-03 MED ORDER — NAPROXEN SODIUM 550 MG PO TABS: 550.0000 mg | ORAL_TABLET | Freq: Two times a day (BID) | ORAL | Status: DC

## 2012-02-03 NOTE — Progress Notes (Signed)
Reviewed and agree.

## 2012-02-03 NOTE — Progress Notes (Signed)
[facility] [address], [place] [address] [phone]  Date:  02/03/2012   Name:  Michael West   DOB:  1970/08/03   MRN:  [id number]  PCP:  Abbe Amsterdam, MD    Chief Complaint: Foot Pain   History of Present Illness:  Michael West is a 41 y.o. very pleasant male patient who presents with the following:  Says that he injured his left ankle four days ago when he hit it against something.  Since has experienced pain and bruising and swelling in the medial ankle. Pain with prolonged standing or stairs and ambulation.   No history of prior injury.  Patient Active Problem List  Diagnosis  . Autoimmune hepatitis    Past Medical History  Diagnosis Date  . Autoimmune hepatitis     Past Surgical History  Procedure Date  . Proctoscopy 12/06/2000    Internal and external hemorrhoidectomy  . Hemorrhoid surgery 12/06/2000  . Dental prosthesis     History  Substance Use Topics  . Smoking status: Former Games developer  . Smokeless tobacco: Never Used  . Alcohol Use: No     Comment: once a week    Family History  Problem Relation Age of Onset  . Colon cancer Mother     No Known Allergies  Medication list has been reviewed and updated.  Current Outpatient Prescriptions on File Prior to Visit  Medication Sig Dispense Refill  . azaTHIOprine (IMURAN) 50 MG tablet Take 1 tablet (50 mg total) by mouth daily.  90 tablet  0  . predniSONE (DELTASONE) 10 MG tablet Take 2 tablets (20 mg total) by mouth daily.  180 tablet  0  . zolpidem (AMBIEN) 5 MG tablet Take 5 mg by mouth at bedtime as needed.        Review of Systems:  As per HPI, otherwise negative.    Physical Examination: Filed Vitals:   02/03/12 1102  BP: 108/72  Pulse: 87  Temp: 98.6 F (37 C)  Resp: 16   Filed Vitals:   02/03/12 1102  Height: 5\' 8"  (1.727 m)  Weight: 185 lb 6.4 oz (84.097 kg)   Body mass index is 28.19 kg/(m^2). Ideal Body Weight: Weight in (lb) to have BMI = 25:  164.1    GEN: WDWN, NAD, Non-toxic, Alert & Oriented x 3 HEENT: Atraumatic, Normocephalic.  Ears and Nose: No external deformity. EXTR: No clubbing/cyanosis/edema NEURO: Normal gait.  PSYCH: Normally interactive. Conversant. Not depressed or anxious appearing.  Calm demeanor.  Left Ankle:  Tender and ecchymotic and swollen.  Guards eversion and inversion.  NATI  Assessment and Plan: Contusion ankle Anaprox Ice  Elevation Air cast Follow up in one week  Carmelina Dane, MD  [facility] reading (PRIMARY) by  Dr. Dareen Piano.  No osseous injury.

## 2012-02-09 ENCOUNTER — Other Ambulatory Visit: Payer: 59

## 2012-02-17 ENCOUNTER — Other Ambulatory Visit (INDEPENDENT_AMBULATORY_CARE_PROVIDER_SITE_OTHER): Payer: 59

## 2012-02-17 ENCOUNTER — Ambulatory Visit (INDEPENDENT_AMBULATORY_CARE_PROVIDER_SITE_OTHER): Payer: 59 | Admitting: Internal Medicine

## 2012-02-17 ENCOUNTER — Encounter: Payer: Self-pay | Admitting: Internal Medicine

## 2012-02-17 VITALS — BP 90/62 | HR 100 | Ht 68.0 in | Wt 185.6 lb

## 2012-02-17 DIAGNOSIS — Z79899 Other long term (current) drug therapy: Secondary | ICD-10-CM

## 2012-02-17 DIAGNOSIS — K754 Autoimmune hepatitis: Secondary | ICD-10-CM

## 2012-02-17 LAB — COMPREHENSIVE METABOLIC PANEL
Alkaline Phosphatase: 75 U/L (ref 39–117)
BUN: 12 mg/dL (ref 6–23)
Creatinine, Ser: 0.8 mg/dL (ref 0.4–1.5)
Glucose, Bld: 121 mg/dL — ABNORMAL HIGH (ref 70–99)
Total Bilirubin: 0.5 mg/dL (ref 0.3–1.2)

## 2012-02-17 LAB — CBC WITH DIFFERENTIAL/PLATELET
Basophils Relative: 0.3 % (ref 0.0–3.0)
Eosinophils Absolute: 0 10*3/uL (ref 0.0–0.7)
Eosinophils Relative: 0.5 % (ref 0.0–5.0)
HCT: 39.8 % (ref 39.0–52.0)
Lymphs Abs: 1.3 10*3/uL (ref 0.7–4.0)
MCHC: 34.1 g/dL (ref 30.0–36.0)
MCV: 97.2 fl (ref 78.0–100.0)
Monocytes Absolute: 0.3 10*3/uL (ref 0.1–1.0)
RBC: 4.1 Mil/uL — ABNORMAL LOW (ref 4.22–5.81)
WBC: 6.8 10*3/uL (ref 4.5–10.5)

## 2012-02-17 NOTE — Patient Instructions (Addendum)
Your physician has requested that you go to the basement for the following lab work before leaving today: CBC, CMET  We will see you back at your appointment 02/23/12 to discuss results.  Thank you for choosing me and [facility].  Iva Boop, M.D., [facility]

## 2012-02-17 NOTE — Progress Notes (Signed)
  Subjective:    Patient ID: Michael West, male    DOB: 03-13-1970, 42 y.o.   MRN: [id number]  HPI The patient returns in followup, for autoimmune hepatitis. He is accompanied by an interpreter. He reports no problems at all at this time. He had a good trip back to [place] see his family in November and December. In the interim he did have a mild ankle injury in still has some pain in the left medial malleolus. Otherwise he is well, he is taking his medications and is not in need of refills he had. Medications, allergies, past medical history, past surgical history, family history and social history are reviewed and updated in the EMR.   Review of Systems As above    Objective:   Physical Exam General:  NAD, mildly cushingoid Asian man Eyes:   anicteric Lungs:  clear Heart:  S1S2 no rubs, murmurs or gallops Abdomen:  soft and nontender, BS+ no hepatosplenomegaly or mass Ext:   no edema        Assessment & Plan:   1. Autoimmune hepatitis   2. Long-term use of immunosuppressant medication - azathioprine    1. Clinically he is doing well. ID biochemical information so we'll get hepatic function panel and CBC today. 2. He will return in one week with the interpreter, we can review his labs and adjust his medications at that time.

## 2012-02-23 ENCOUNTER — Encounter: Payer: Self-pay | Admitting: Internal Medicine

## 2012-02-23 ENCOUNTER — Other Ambulatory Visit: Payer: Self-pay

## 2012-02-23 ENCOUNTER — Other Ambulatory Visit (INDEPENDENT_AMBULATORY_CARE_PROVIDER_SITE_OTHER): Payer: 59

## 2012-02-23 ENCOUNTER — Telehealth: Payer: Self-pay

## 2012-02-23 ENCOUNTER — Ambulatory Visit (INDEPENDENT_AMBULATORY_CARE_PROVIDER_SITE_OTHER): Payer: 59 | Admitting: Internal Medicine

## 2012-02-23 VITALS — BP 110/74 | HR 92 | Ht 68.0 in | Wt 187.2 lb

## 2012-02-23 DIAGNOSIS — R109 Unspecified abdominal pain: Secondary | ICD-10-CM

## 2012-02-23 DIAGNOSIS — M25572 Pain in left ankle and joints of left foot: Secondary | ICD-10-CM

## 2012-02-23 DIAGNOSIS — K754 Autoimmune hepatitis: Secondary | ICD-10-CM

## 2012-02-23 DIAGNOSIS — R350 Frequency of micturition: Secondary | ICD-10-CM

## 2012-02-23 DIAGNOSIS — M7918 Myalgia, other site: Secondary | ICD-10-CM

## 2012-02-23 DIAGNOSIS — M25579 Pain in unspecified ankle and joints of unspecified foot: Secondary | ICD-10-CM

## 2012-02-23 DIAGNOSIS — IMO0002 Reserved for concepts with insufficient information to code with codable children: Secondary | ICD-10-CM

## 2012-02-23 DIAGNOSIS — IMO0001 Reserved for inherently not codable concepts without codable children: Secondary | ICD-10-CM

## 2012-02-23 LAB — URINALYSIS, ROUTINE W REFLEX MICROSCOPIC
Hgb urine dipstick: NEGATIVE
Nitrite: NEGATIVE
Specific Gravity, Urine: 1.02 (ref 1.000–1.030)
Total Protein, Urine: NEGATIVE
pH: 8 (ref 5.0–8.0)

## 2012-02-23 MED ORDER — AZATHIOPRINE 50 MG PO TABS: 50.0000 mg | ORAL_TABLET | Freq: Every day | ORAL | Status: DC

## 2012-02-23 MED ORDER — PREDNISONE 5 MG PO TABS: 15.0000 mg | ORAL_TABLET | Freq: Every day | ORAL | Status: DC

## 2012-02-23 NOTE — Telephone Encounter (Signed)
Message copied by [place], Mickayla Trouten E on Thu Feb 23, 2012 12:58 PM ------      Message from: Stan Head E      Created: Thu Feb 23, 2012 12:35 PM       Urine test negative      No new recommendations      Please tell interpreter      If treatment prescribed does not help - go to PCP

## 2012-02-23 NOTE — Telephone Encounter (Signed)
Informed the translator Michael West from [employer] at his cell # [phone] of neg urine results.  He is going to contact patient with this information.

## 2012-02-23 NOTE — Patient Instructions (Addendum)
Your physician has requested that you go to the basement for the following lab work before leaving today: Urinalysis  We have sent the following medications to your pharmacy for you to pick up at your convenience: Generic Imuran, Prednisone 5 mg.  Don't take your Prednisone 20mg , put that bottle away .  We want you to come back in a month and have blood work and see Dr. Leone Payor all in the same day.  We have made you an appointment for 03/27/12 at 11:00am, please go to the lab prior to your visit (at least an hour before)  If your ankle continues to bother you return to the Urgent Care practice.  Thank you for choosing me and [facility].  Iva Boop, M.D., [facility]

## 2012-02-23 NOTE — Progress Notes (Signed)
  Subjective:    Patient ID: Michael West, male    DOB: [birth year], 42 y.o.   MRN: [id number]  HPI The patient is here with his wife and an interpreter. This is one week after visit where we saw him in followup, and through labs. His liver chemistries have normalized the exception of a trivial AST elevation. Lab Results  Component Value Date   ALT 14 02/17/2012   AST 38* 02/17/2012   ALKPHOS 75 02/17/2012   BILITOT 0.5 02/17/2012    He had some left medial ankle pain the last time, which she started in December. Now he is complaining of left lower back pain. Doesn't Remeron injury but he was lifting heavy things at work like he normally does. It's been there for 4 days, it is getting a little bit better. There is no numbness or tingling. He didn't think his left lower extremity was weak at one point but is not now. He reports urinating frequently at night but no fevers or changes in the color of his urine am aware of. Medications, allergies, past medical history, past surgical history, family history and social history are reviewed and updated in the EMR.  Review of Systems As above    Objective:   Physical Exam Somewhat cushingoid [place] man no acute distress Abdomen is nontender The back is slightly tender in the buttock is slightly tender on the left. He has normal range of motion with internal/external rotation of the hips, and the lower extremities. There is no straight leg raise sign. The deep tendon reflexes are symmetrical on the knees, and depressed. There is no clonus. He has normal strength bilaterally in both lower extremities. That includes hip flexion, leg flexion and extension and foot flexion and extension.     Assessment & Plan:   1. Autoimmune hepatitis treated with steroids and azathioprine- significantly improved.    2. Left flank pain   3. Left buttock pain- suspect a lumbar strain   4. Urinary frequency   5. Left ankle pain- becoming chronic, etiology not clear  this was present at last visit and before he developed a pain in the left lower back area.       1. He will start tapering prednisone, going to 15 mg daily and in early February going to 10 mg daily until I see him in followup in about 2 months at which time we will do labs also prior to visit. 2. I did a urinalysis that was negative. 3. He says the pain is improving so I think we will see how things go if he does not improve with respect to the lumbar strain diagnosis he should go back to his primary care provider for further evaluation and also for this ankle problem. 4. I think it would be too early for something like in steroid related rack sure, it doesn't look like that. We will need followup on this, and need to consider supplementation. Will add a vitamin D level to his labs that he gets in March.

## 2012-02-23 NOTE — Progress Notes (Signed)
Quick Note:  Urine test negative No new recommendations Please tell interpreter If treatment prescribed does not help - go to PCP ______

## 2012-02-25 ENCOUNTER — Other Ambulatory Visit: Payer: Self-pay | Admitting: Internal Medicine

## 2012-03-27 ENCOUNTER — Encounter: Payer: Self-pay | Admitting: Internal Medicine

## 2012-03-27 ENCOUNTER — Other Ambulatory Visit (INDEPENDENT_AMBULATORY_CARE_PROVIDER_SITE_OTHER): Payer: 59

## 2012-03-27 ENCOUNTER — Ambulatory Visit (INDEPENDENT_AMBULATORY_CARE_PROVIDER_SITE_OTHER): Payer: 59 | Admitting: Internal Medicine

## 2012-03-27 VITALS — BP 100/70 | HR 92 | Ht 68.0 in | Wt 190.6 lb

## 2012-03-27 DIAGNOSIS — K754 Autoimmune hepatitis: Secondary | ICD-10-CM

## 2012-03-27 DIAGNOSIS — IMO0002 Reserved for concepts with insufficient information to code with codable children: Secondary | ICD-10-CM

## 2012-03-27 DIAGNOSIS — Z79899 Other long term (current) drug therapy: Secondary | ICD-10-CM

## 2012-03-27 LAB — CBC WITH DIFFERENTIAL/PLATELET
Basophils Absolute: 0 10*3/uL (ref 0.0–0.1)
Eosinophils Absolute: 0 10*3/uL (ref 0.0–0.7)
Lymphocytes Relative: 24.2 % (ref 12.0–46.0)
MCHC: 34 g/dL (ref 30.0–36.0)
Neutrophils Relative %: 71.8 % (ref 43.0–77.0)
Platelets: 169 10*3/uL (ref 150.0–400.0)
RDW: 13.9 % (ref 11.5–14.6)

## 2012-03-27 LAB — COMPREHENSIVE METABOLIC PANEL
ALT: 13 U/L (ref 0–53)
AST: 39 U/L — ABNORMAL HIGH (ref 0–37)
Albumin: 4 g/dL (ref 3.5–5.2)
Calcium: 9.3 mg/dL (ref 8.4–10.5)
Chloride: 103 mEq/L (ref 96–112)
Potassium: 4.1 mEq/L (ref 3.5–5.1)

## 2012-03-27 MED ORDER — PREDNISONE 5 MG PO TABS: 10.0000 mg | ORAL_TABLET | Freq: Every day | ORAL | Status: DC

## 2012-03-27 MED ORDER — AZATHIOPRINE 50 MG PO TABS: 50.0000 mg | ORAL_TABLET | Freq: Every day | ORAL | Status: DC

## 2012-03-27 NOTE — Patient Instructions (Addendum)
Please have labs drawn prior to your appointment that has been made for April 15th at 11:00am. No appointment needed for labwork.  On March 6th go to one prednisone tablet a day.  We have put refills on your Imuran for you to pick up when needed.  Thank you for choosing me and [facility].  Iva Boop, M.D., [facility]

## 2012-03-27 NOTE — Progress Notes (Signed)
  Subjective:    Patient ID: Michael West, male    DOB: 06-Aug-1970, 42 y.o.   MRN: [id number]  HPI The patient returns in followup, he has autoimmune hepatitis and has been treated with prednisone and azathioprine. He has no complaints. He has gained a few pounds. He is here with an interpreter. Medications, allergies, past medical history, past surgical history, family history and social history are reviewed and updated in the EMR.   Review of Systems As above    Objective:   Physical Exam Well-developed middle-aged Asian man in no acute distress. There is less moon facies today.     Chemistry      Component Value Date/Time   NA 138 03/27/2012 0937   K 4.1 03/27/2012 0937   CL 103 03/27/2012 0937   CO2 28 03/27/2012 0937   BUN 11 03/27/2012 0937   CREATININE 0.8 03/27/2012 0937   CREATININE 0.72 08/17/2011 0911      Component Value Date/Time   CALCIUM 9.3 03/27/2012 0937   ALKPHOS 65 03/27/2012 0937   AST 39* 03/27/2012 0937   ALT 13 03/27/2012 0937   BILITOT 0.6 03/27/2012 0937     Lab Results  Component Value Date   WBC 4.8 03/27/2012   HGB 13.4 03/27/2012   HCT 39.3 03/27/2012   MCV 96.3 03/27/2012   PLT 169.0 03/27/2012       Assessment & Plan:   1. Autoimmune hepatitis treated with prednisone and azathioprine   2. Long-term use of immunosuppressant medication on azathioprine    1. He has nearly normal LFTs at this point and is doing well the paper. 2 weeks ago he reduce prednisone for 15-10 mg daily. He is tolerating the azathioprine just fine. 2. Vitamin D level is pending from labs this week. 3. I will have a repeat a CBC and LFTs in 2 months and see me shortly thereafter. 4. On March 6 he is to reduce prednisone to 5 mg daily. 5. Ultimate goal is to remove him from steroids. 6. Somewhere down the line a repeat liver biopsy would be in order.

## 2012-03-30 ENCOUNTER — Other Ambulatory Visit: Payer: Self-pay

## 2012-03-30 ENCOUNTER — Encounter: Payer: Self-pay | Admitting: Internal Medicine

## 2012-03-30 DIAGNOSIS — E559 Vitamin D deficiency, unspecified: Secondary | ICD-10-CM

## 2012-03-30 HISTORY — DX: Vitamin D deficiency, unspecified: E55.9

## 2012-03-30 MED ORDER — ERGOCALCIFEROL 1.25 MG (50000 UT) PO CAPS: 50000.0000 [IU] | ORAL_CAPSULE | ORAL | Status: DC

## 2012-03-30 NOTE — Progress Notes (Signed)
Quick Note:  Need to mail him a letter and Rx for vit D3 50,000 units weekly # 4 with 1 refill - take x 8 weeks   ______

## 2012-04-19 ENCOUNTER — Ambulatory Visit: Payer: 59

## 2012-04-19 ENCOUNTER — Ambulatory Visit (INDEPENDENT_AMBULATORY_CARE_PROVIDER_SITE_OTHER): Payer: 59 | Admitting: Family Medicine

## 2012-04-19 VITALS — BP 98/78 | HR 90 | Temp 97.8°F | Resp 18 | Ht 68.0 in | Wt 185.0 lb

## 2012-04-19 DIAGNOSIS — M25579 Pain in unspecified ankle and joints of unspecified foot: Secondary | ICD-10-CM

## 2012-04-19 DIAGNOSIS — M25571 Pain in right ankle and joints of right foot: Secondary | ICD-10-CM

## 2012-04-19 DIAGNOSIS — R209 Unspecified disturbances of skin sensation: Secondary | ICD-10-CM

## 2012-04-19 DIAGNOSIS — R202 Paresthesia of skin: Secondary | ICD-10-CM

## 2012-04-19 LAB — POCT GLYCOSYLATED HEMOGLOBIN (HGB A1C): Hemoglobin A1C: 5.5

## 2012-04-19 MED ORDER — DICLOFENAC SODIUM 75 MG PO TBEC: 75.0000 mg | DELAYED_RELEASE_TABLET | Freq: Two times a day (BID) | ORAL | Status: DC

## 2012-04-19 MED ORDER — TRAMADOL HCL 50 MG PO TABS: 50.0000 mg | ORAL_TABLET | Freq: Three times a day (TID) | ORAL | Status: DC | PRN

## 2012-04-19 NOTE — Progress Notes (Signed)
[facility]:  Office Visit  Chief Complaint:  Chief Complaint  Patient presents with  . Foot Pain    right    HPI: Michael West is a 42 y.o. male who complains of  Right foot pain, on top of foot associated with intermittent numbness/tingling x 10 days, walks on concrete during his work shift >10 hrs, wears steel tip shoes. No prior foot pain, surgeries or injuries. Denies diabetes. Deneis gout.   Past Medical History  Diagnosis Date  . Autoimmune hepatitis   . Unspecified vitamin D deficiency 03/30/2012    .03/2012 - Level is 14   Past Surgical History  Procedure Laterality Date  . Proctoscopy  12/06/2000    Internal and external hemorrhoidectomy  . Hemorrhoid surgery  12/06/2000  . Dental prosthesis     History   Social History  . Marital Status: Married    Spouse Name: Tristan Schroeder    Number of Children: 0  . Years of Education: 3   Occupational History  .  [employer]   Social History Main Topics  . Smoking status: Former Games developer  . Smokeless tobacco: Never Used  . Alcohol Use: No     Comment: once a week  . Drug Use: No  . Sexually Active: Yes   Other Topics Concern  . None   Social History Narrative   Moved to the [place] from [place] in 11/1996.   Family History  Problem Relation Age of Onset  . Colon cancer Mother    No Known Allergies Prior to Admission medications   Medication Sig Start Date End Date Taking? Authorizing Elvenia Godden  azaTHIOprine (IMURAN) 50 MG tablet Take 1 tablet (50 mg total) by mouth daily. 03/27/12  Yes Iva Boop, MD  ergocalciferol (VITAMIN D2) 50000 UNITS capsule Take 1 capsule (50,000 Units total) by mouth once a week. 03/30/12   Iva Boop, MD  predniSONE (DELTASONE) 5 MG tablet Take 2 tablets (10 mg total) by mouth daily. On March 6 reduce to 1 tablet a day 03/27/12   Iva Boop, MD  zolpidem (AMBIEN) 5 MG tablet TAKE ONE TABLET BY MOUTH AT BEDTIME AS NEEDED FOR SLEEP 02/25/12   Iva Boop, MD     ROS:  The patient denies fevers, chills, night sweats, unintentional weight loss, chest pain, palpitations, wheezing, dyspnea on exertion, nausea, vomiting, abdominal pain, dysuria, hematuria, melena. All other systems have been reviewed and were otherwise negative with the exception of those mentioned in the HPI and as above.    PHYSICAL EXAM: Filed Vitals:   04/19/12 1241  BP: 98/78  Pulse: 90  Temp: 97.8 F (36.6 C)  Resp: 18   Filed Vitals:   04/19/12 1241  Height: 5\' 8"  (1.727 m)  Weight: 185 lb (83.915 kg)   Body mass index is 28.14 kg/(m^2).  General: Alert, no acute distress HEENT:  Normocephalic, atraumatic, oropharynx patent.  Cardiovascular:  Regular rate and rhythm, no rubs murmurs or gallops.  No Carotid bruits, radial pulse intact. No pedal edema.  Respiratory: Clear to auscultation bilaterally.  No wheezes, rales, or rhonchi.  No cyanosis, no use of accessory musculature GI: No organomegaly, abdomen is soft and non-tender, positive bowel sounds.  No masses. Skin: No rashes. Neurologic: Facial musculature symmetric. Psychiatric: Patient is appropriate throughout our interaction. Lymphatic: No cervical lymphadenopathy Musculoskeletal: Gait intact. + tender on top of right foot  Mid foot.  Full ROM , 5/5 motor,  senstation intact.  + DP  n o deformities, no gouty tophus  LABS: Results for orders placed in visit on 04/19/12  POCT GLYCOSYLATED HEMOGLOBIN (HGB A1C)      Result Value Range   Hemoglobin A1C 5.5       EKG/XRAY:   Primary read interpreted by Dr. Conley Rolls at [facility]. No fx/Dislocation   ASSESSMENT/PLAN: Encounter Diagnoses  Name Primary?  . Pain in joint, ankle and foot, right Yes  . Paresthesia of foot    Use prn with caution. He has Hepatitis. Rx Diclofenac Rx Tramadol Use bigger shoes, do not lace shoes so tightly F/u prn    LE, THAO PHUONG, DO 04/19/2012 2:35 PM

## 2012-05-14 ENCOUNTER — Other Ambulatory Visit (INDEPENDENT_AMBULATORY_CARE_PROVIDER_SITE_OTHER): Payer: 59

## 2012-05-14 DIAGNOSIS — Z79899 Other long term (current) drug therapy: Secondary | ICD-10-CM

## 2012-05-14 DIAGNOSIS — K754 Autoimmune hepatitis: Secondary | ICD-10-CM

## 2012-05-14 LAB — CBC WITH DIFFERENTIAL/PLATELET
Eosinophils Relative: 1.8 % (ref 0.0–5.0)
HCT: 37.7 % — ABNORMAL LOW (ref 39.0–52.0)
Hemoglobin: 13.1 g/dL (ref 13.0–17.0)
Lymphs Abs: 2 10*3/uL (ref 0.7–4.0)
MCV: 93.2 fl (ref 78.0–100.0)
Monocytes Absolute: 0.4 10*3/uL (ref 0.1–1.0)
Monocytes Relative: 7.9 % (ref 3.0–12.0)
Neutro Abs: 2.7 10*3/uL (ref 1.4–7.7)
RDW: 13.6 % (ref 11.5–14.6)
WBC: 5.3 10*3/uL (ref 4.5–10.5)

## 2012-05-14 LAB — HEPATIC FUNCTION PANEL
ALT: 19 U/L (ref 0–53)
AST: 37 U/L (ref 0–37)
Albumin: 4 g/dL (ref 3.5–5.2)
Alkaline Phosphatase: 86 U/L (ref 39–117)
Bilirubin, Direct: 0.1 mg/dL (ref 0.0–0.3)
Total Bilirubin: 0.6 mg/dL (ref 0.3–1.2)
Total Protein: 7.8 g/dL (ref 6.0–8.3)

## 2012-05-22 ENCOUNTER — Ambulatory Visit (INDEPENDENT_AMBULATORY_CARE_PROVIDER_SITE_OTHER): Payer: 59 | Admitting: Internal Medicine

## 2012-05-22 ENCOUNTER — Encounter: Payer: Self-pay | Admitting: Internal Medicine

## 2012-05-22 VITALS — BP 98/70 | HR 80 | Ht 68.0 in | Wt 185.0 lb

## 2012-05-22 DIAGNOSIS — K754 Autoimmune hepatitis: Secondary | ICD-10-CM

## 2012-05-22 DIAGNOSIS — E559 Vitamin D deficiency, unspecified: Secondary | ICD-10-CM

## 2012-05-22 DIAGNOSIS — M25579 Pain in unspecified ankle and joints of unspecified foot: Secondary | ICD-10-CM

## 2012-05-22 MED ORDER — VITAMIN D3 1.25 MG (50000 UT) PO CAPS: 1.0000 | ORAL_CAPSULE | ORAL | Status: DC

## 2012-05-22 NOTE — Progress Notes (Signed)
  Subjective:    Patient ID: Michael West, male    DOB: [birth year], 42 y.o.   MRN: [id number]  HPI He is here with wife and interpreter. Off prednisone x weeks. CBC and LFT's were normal. He is having 2 months of bilateral ankle and foot pain. Worse upon arising and walking. Pain goes into lower shins. Wife says appetite is off also. Wt Readings from Last 3 Encounters:  05/22/12 185 lb (83.915 kg)  04/19/12 185 lb (83.915 kg)  03/27/12 190 lb 9.6 oz (86.456 kg)   He may have some other occasional muscle or jint pains but the foot and ankle problems are daily.  Was seen in [facility] last month and ? If it was a shoe problem.  Medications, allergies, past medical history, past surgical history, family history and social history are reviewed and updated in the EMR.   Review of Systems No rash, fever Vitamin D level low - Rx was not at pharmacy    Objective:   Physical Exam General:  NAD Eyes:   anicteric Abdomen:  soft and nontender, BS+ Ext:   no edema, he is mildly tender over tendons of dorsal ankle, heels are tender bilaterally, good foot and ankle strength  Data Reviewed:  Last cbc and LFT's in EMR [facility] note March 2013     Assessment & Plan:  Autoimmune hepatitis - Plan: CBC with Differential, Hepatic function panel in 3 months, stay on AZA - doing well on that and off prednisone. See me in 3 months also  Unspecified vitamin D deficiency - Plan: Cholecalciferol (VITAMIN D3) 50000 UNITS CAPS, Vitamin D (25 hydroxy) x 8 weeks and recheck level next labs  Pain in joint, ankle and foot, unspecified laterality - ? Plantar fasciitis - a bit difficult to tell , interpreter helps but not sure he has this - seems possible. Have advised re: stretching exercises and asked that he follow-up with PCP Dr. Patsy Lager. He can do that 4/18 10-7 at [facility] and I was told he could walk-in. ? If needs sports med/ortho appointment but PCP may be able to further dx/treat. Before that. Interpreter  should come.  WU:[email or web address], MD

## 2012-05-22 NOTE — Patient Instructions (Addendum)
Please go and see Dr. Patsy Lager on 05/25/12 between  10-7pm, eariler is better.  They are located at [address], [place] [address].  Please take an interrupter.  Your Vitamin D has been sent to the pharmacy for you to pick up.  Come get labs drawn in early July to check your CBC/diff, liver functions, Vitamin D level.  Then see Dr. Leone Payor on 08/14/12 at 11:00am.  Thank you for choosing me and [facility].  Iva Boop, M.D., [facility]    The Interrupter from today is going to meet them there, they will contact each other with a time.

## 2012-05-25 ENCOUNTER — Ambulatory Visit (INDEPENDENT_AMBULATORY_CARE_PROVIDER_SITE_OTHER): Payer: 59 | Admitting: Family Medicine

## 2012-05-25 VITALS — BP 112/77 | HR 84 | Temp 97.9°F | Resp 16 | Ht 68.5 in | Wt 189.0 lb

## 2012-05-25 DIAGNOSIS — M79671 Pain in right foot: Secondary | ICD-10-CM

## 2012-05-25 DIAGNOSIS — M79609 Pain in unspecified limb: Secondary | ICD-10-CM

## 2012-05-25 NOTE — Patient Instructions (Addendum)
We are going to make a referral for you to visit an orthopedist/ sports medicine specialist regarding your feet.  Their offices are closed today for the holiday, but we will call and arrange something next week.   In the meantime you can use your medications as needed for pain.

## 2012-05-25 NOTE — Progress Notes (Signed)
[facility] [address], [place] [address] [phone]  Date:  05/25/2012   Name:  Michael West   DOB:  Sep 16, 1970   MRN:  [id number]  PCP:  Abbe Amsterdam, MD    Chief Complaint: Plantar Fasciitis   History of Present Illness:  Michael West is a 42 y.o. very pleasant male patient who presents with the following:  I saw [place] once last July, when he was diagnosed with autoimmune hepatitis.  He is seeing Dr. Leone Payor and doing much better.  Here today for a problem with his feet- bilaterally.  He has noted foot pain for about one month, maybe longer.  He has been given naproxen and hydrocodone, but his pain persists.  He does note more pain when he first gets up in the morning.  However, the pain also persists with prolonged walking and is located in the ball of the foot.  He does walk a lot at his job.  Otherwise there is no injury or change in routine.    Seen today with a friend who also served as his interpreter  Patient Active Problem List  Diagnosis  . Autoimmune hepatitis  . Long-term use of immunosuppressant medication - azathioprine  . Unspecified vitamin D deficiency    Past Medical History  Diagnosis Date  . Autoimmune hepatitis   . Unspecified vitamin D deficiency 03/30/2012    .03/2012 - Level is 14    Past Surgical History  Procedure Laterality Date  . Proctoscopy  12/06/2000    Internal and external hemorrhoidectomy  . Hemorrhoid surgery  12/06/2000  . Dental prosthesis      History  Substance Use Topics  . Smoking status: Never Smoker   . Smokeless tobacco: Never Used  . Alcohol Use: No    Family History  Problem Relation Age of Onset  . Colon cancer Mother     No Known Allergies  Medication list has been reviewed and updated.  Current Outpatient Prescriptions on File Prior to Visit  Medication Sig Dispense Refill  . azaTHIOprine (IMURAN) 50 MG tablet Take 1 tablet (50 mg total) by mouth daily.  30 tablet  3  .  Cholecalciferol (VITAMIN D3) 50000 UNITS CAPS Take 1 capsule by mouth once a week. X 8 weeks  8 capsule  0   No current facility-administered medications on file prior to visit.    Review of Systems:  As per HPI- otherwise negative.   Physical Examination: Filed Vitals:   05/25/12 1207  BP: 112/77  Pulse: 84  Temp: 97.9 F (36.6 C)  Resp: 16   Filed Vitals:   05/25/12 1207  Height: 5' 8.5" (1.74 m)  Weight: 189 lb (85.73 kg)   Body mass index is 28.32 kg/(m^2). Ideal Body Weight: Weight in (lb) to have BMI = 25: 166.5   GEN: WDWN, NAD, Non-toxic, Alert & Oriented x 3 HEENT: Atraumatic, Normocephalic.  Ears and Nose: No external deformity. EXTR: No clubbing/cyanosis/edema NEURO: Normal gait.  PSYCH: Normally interactive. Conversant. Not depressed or anxious appearing.  Calm demeanor.  Bilateral feet: poor arches/ pes planus, tenderness under the ball of the foot bilaterally.  No tenderness at heel PF insertion.  Foot is normally flexible, no swelling, redness/ heat/ bruising.  He is also tender over the ventral foot, over the 3rd and 4th MT.  Positive squeeze test.    Assessment and Plan: Pain in both feet - Plan: AMB referral to orthopedics  Pain in both feet of  uncertain etiology.  I do not necessarily think he has plantar fasciitis. Will refer to sports medicine for consultation  Signed Abbe Amsterdam, MD

## 2012-07-31 IMAGING — US US ABDOMEN COMPLETE
1 series · 13 of 25 positions shown · non-contrast
Comparison: None.

CLINICAL DATA: Abdominal pain, jaundice, nausea

COMPLETE ABDOMINAL ULTRASOUND

[Series 1: us abdomen complete · 0.24mm/px · 13 of 66 slices shown]
[im 1/66]
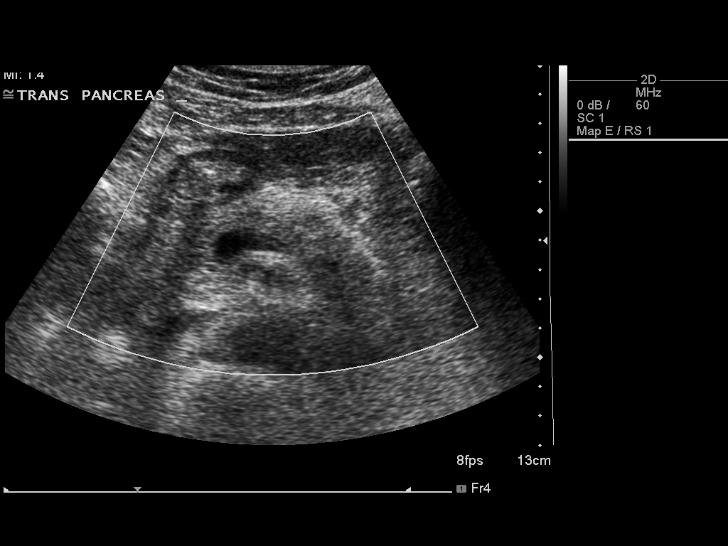
[im 6/66]
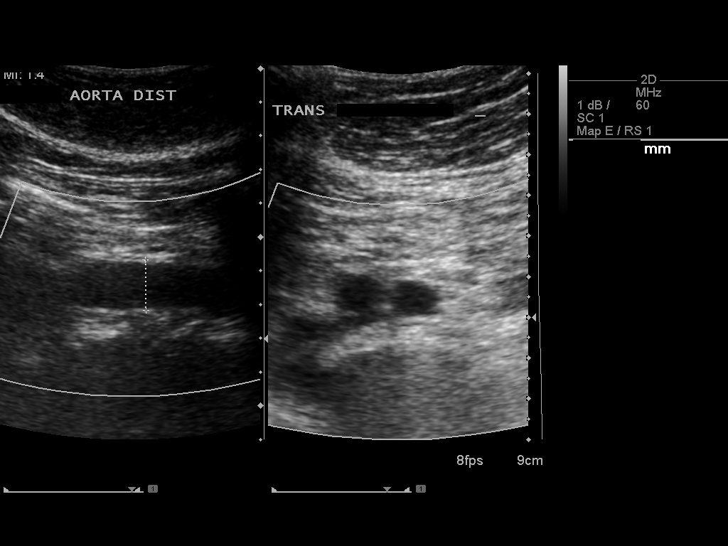
[im 11/66]
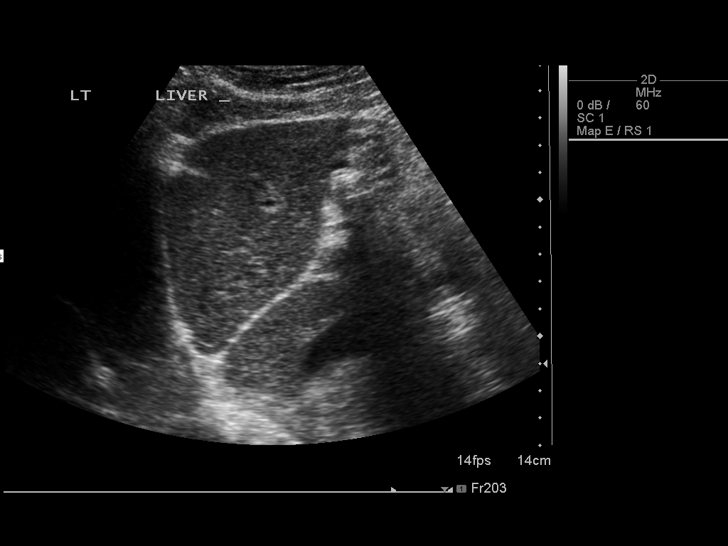
[im 17/66]
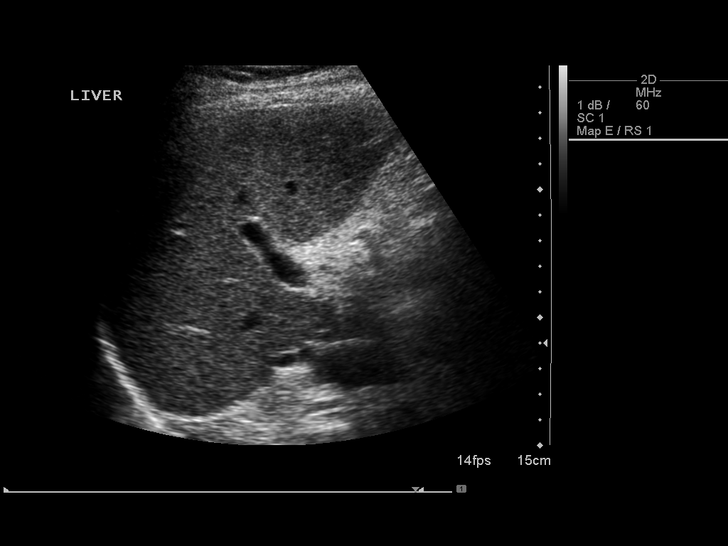
[im 22/66]
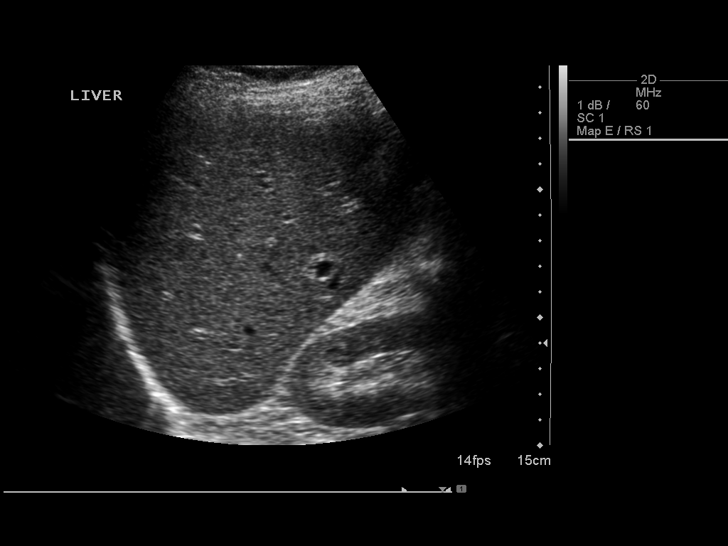
[im 28/66]
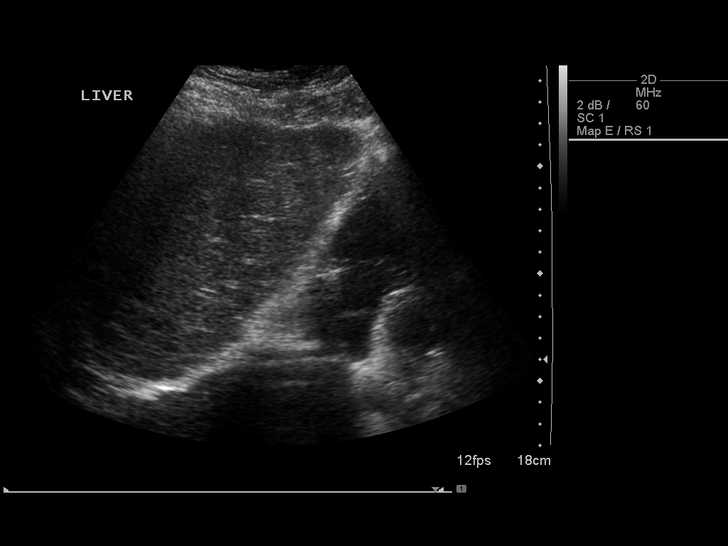
[im 33/66]
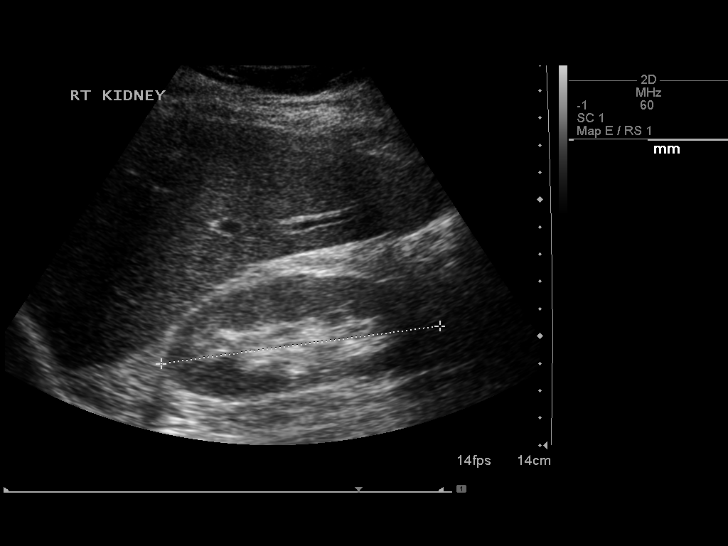
[im 38/66]
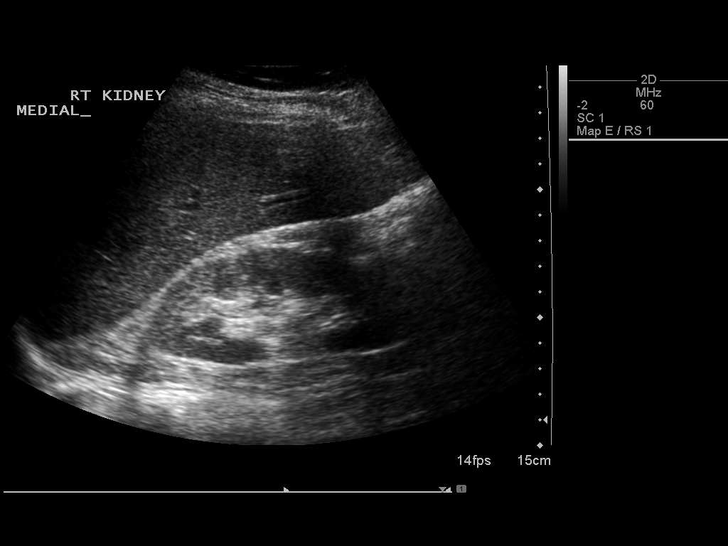
[im 44/66]
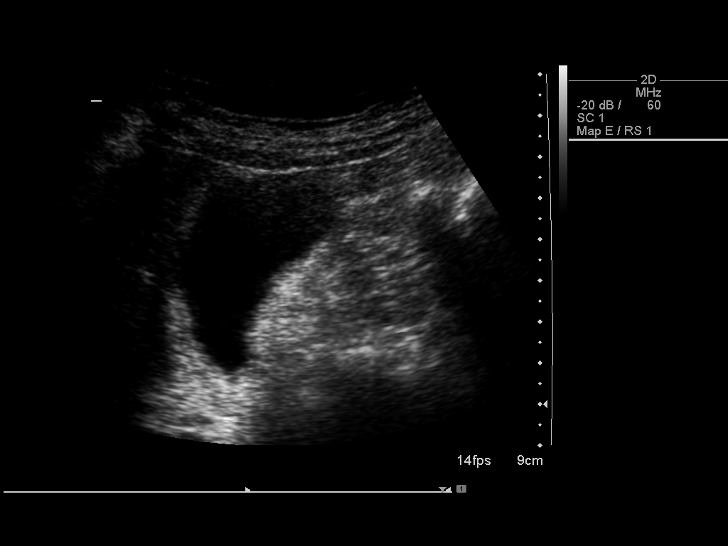
[im 49/66]
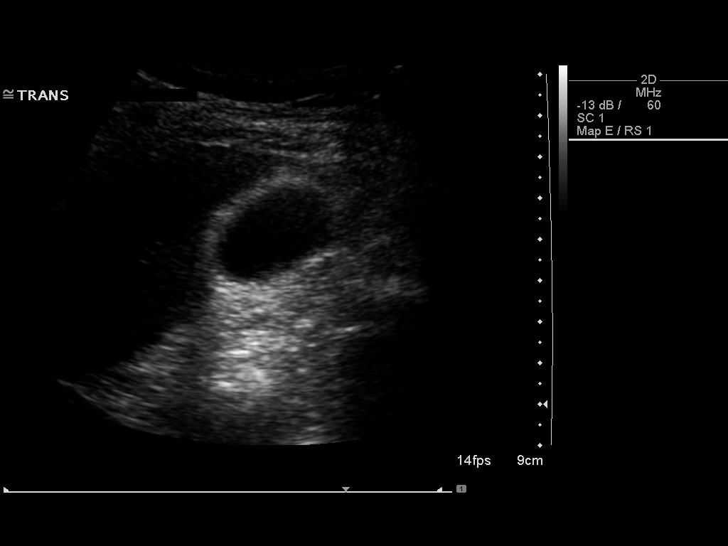
[im 55/66]
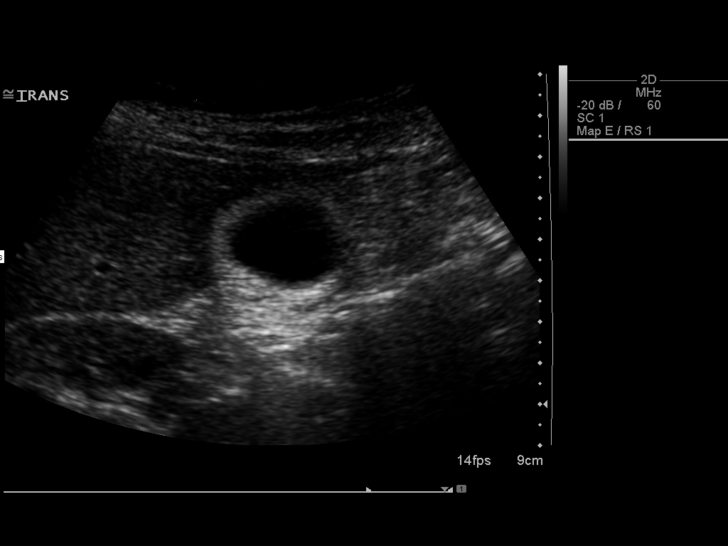
[im 60/66]
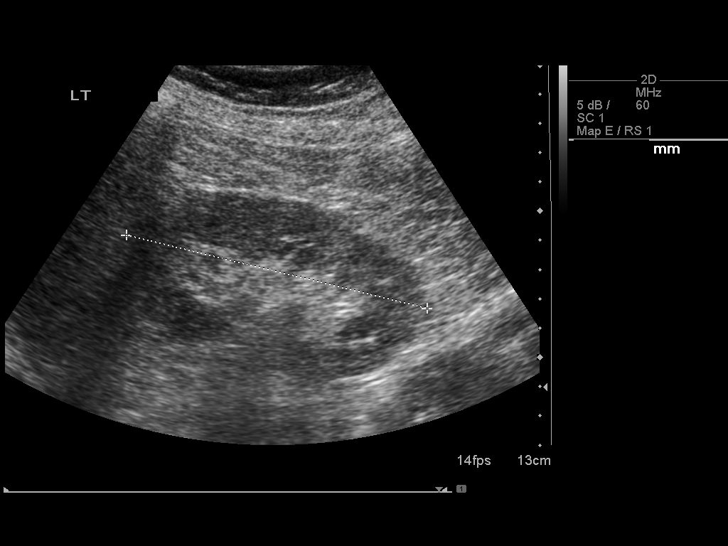
[im 66/66]
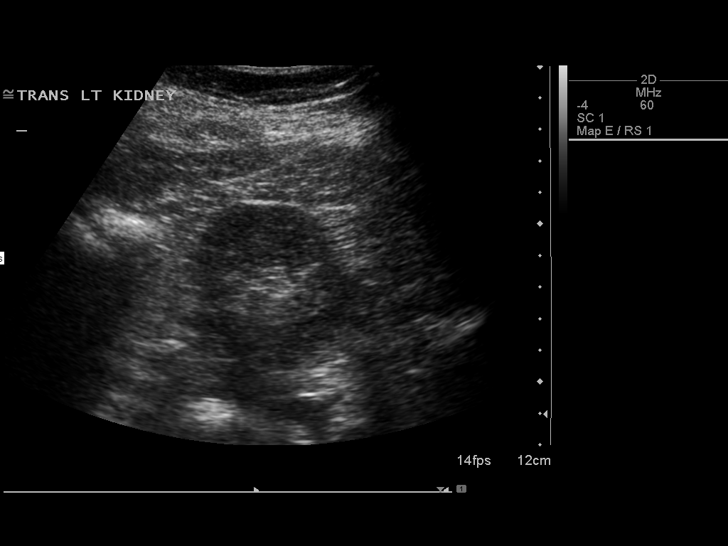

[13 of 25 positions shown; findings below may reference images not displayed]

FINDINGS: Gallbladder:  The gallbladder is visualized and no gallstones are
noted.  The gallbladder wall is somewhat thickened measuring
mm.  This may be due to hypoproteinemia, with no gallstones
evident.  There is no pain over the gallbladder with compression.

Common bile duct:  The common bile duct is normal measuring 2.7 mm
in diameter.

Liver:  The liver has a normal echogenic pattern.  No ductal
dilatation is seen.

IVC:  Appears normal.

Pancreas:  The tail of the pancreas is obscured by bowel gas and
cannot be evaluated.

Spleen:  The spleen is normal measuring 7.7 cm sagittally.

Right Kidney:  No hydronephrosis is seen.  The right kidney
measures 11.3 cm sagittally.

Left Kidney:  No hydronephrosis is seen.  The left kidney measures
10.6 cm.

Abdominal aorta:  The abdominal aorta is normal in caliber.
IMPRESSION: 1.  Thickened gallbladder wall may be due to hypoproteinemia.  No
gallstones are seen and there is no pain over the gallbladder with
compression.
2.  The tail of the pancreas is obscured by bowel gas.
3.  No intrahepatic ductal dilatation is seen.

## 2012-08-07 ENCOUNTER — Other Ambulatory Visit (INDEPENDENT_AMBULATORY_CARE_PROVIDER_SITE_OTHER): Payer: 59

## 2012-08-07 DIAGNOSIS — K754 Autoimmune hepatitis: Secondary | ICD-10-CM

## 2012-08-07 DIAGNOSIS — E559 Vitamin D deficiency, unspecified: Secondary | ICD-10-CM

## 2012-08-07 LAB — HEPATIC FUNCTION PANEL
Alkaline Phosphatase: 77 U/L (ref 39–117)
Bilirubin, Direct: 0.1 mg/dL (ref 0.0–0.3)
Total Bilirubin: 0.5 mg/dL (ref 0.3–1.2)
Total Protein: 8.6 g/dL — ABNORMAL HIGH (ref 6.0–8.3)

## 2012-08-07 LAB — CBC WITH DIFFERENTIAL/PLATELET
Eosinophils Relative: 1.2 % (ref 0.0–5.0)
Lymphocytes Relative: 29.3 % (ref 12.0–46.0)
Monocytes Absolute: 0.3 10*3/uL (ref 0.1–1.0)
Monocytes Relative: 5.1 % (ref 3.0–12.0)
Neutrophils Relative %: 64 % (ref 43.0–77.0)
Platelets: 203 10*3/uL (ref 150.0–400.0)
WBC: 6.1 10*3/uL (ref 4.5–10.5)

## 2012-08-12 IMAGING — US US BIOPSY
1 series · 13 of 22 positions shown · non-contrast
Comparison: none

Clinical: Elevated liver functions of uncertain etiology.  Request
has been made for random liver core biopsy.

ULTRASOUND-GUIDED RANDOM CORE LIVER BIOPSY:
An ultrasound guided liver biopsy was thoroughly discussed with the
patient with the assistance of an interpreter and questions were
answered. The benefits, risks, alternatives, and complications were
also discussed. The patient understands and wishes to proceed with
the procedure. A verbal as well as written consent was obtained.
Preprocedural ultrasound scanning demonstrates an approximately
x 1.0 x 0.9 cm hypoechoic lesion within the dome of the right lobe
of the liver which does not demonstrate definite internal blood
flow.  This lesion was not definitively seen on prior abdominal CT
performed 08/26/2011.
 Ultrasound imaging of the liver was performed and an appropriate
skin entry site was determined. Skin site was marked, prepped and
draped in the usual sterile fashion. 1% Lidocaine was infiltrated
locally. A 17 gauge Trocar needle was advanced under ultrasound
guidance into the liver. Imaging was obtained documenting
appropriate needle position.  4 coaxial 18 gauge core samples were
then obtained and sent to the laboratory for further analysis. Post
procedure scans demonstrate no evidence of bleeding or hematoma.
The patient tolerated the procedure well with no immediate
complication.
Cardiac and respiratory monitoring was provided by the radiology
RN.  Moderate sedation in the form of 50 mcg fentanyl and 2 mg of
versed were administered throughout the procedure for a total of 15
minutes.

[Series 1: us biopsy · 0.32mm/px · 13 of 22 slices shown]
[im 1/22]
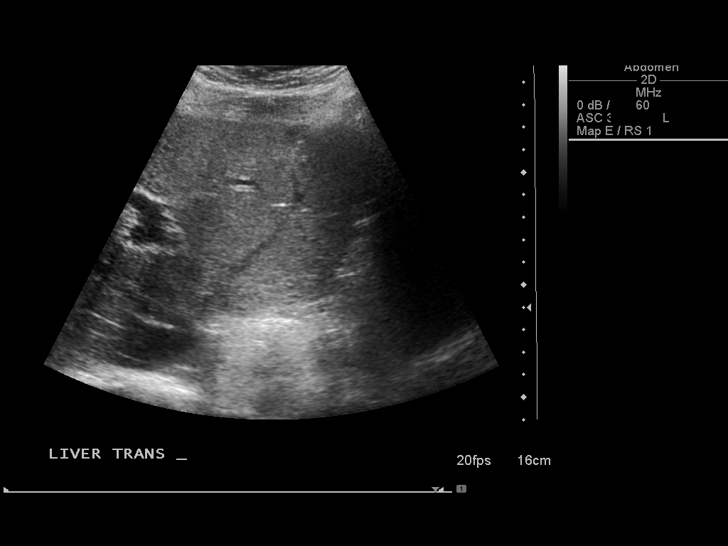
[im 3/22]
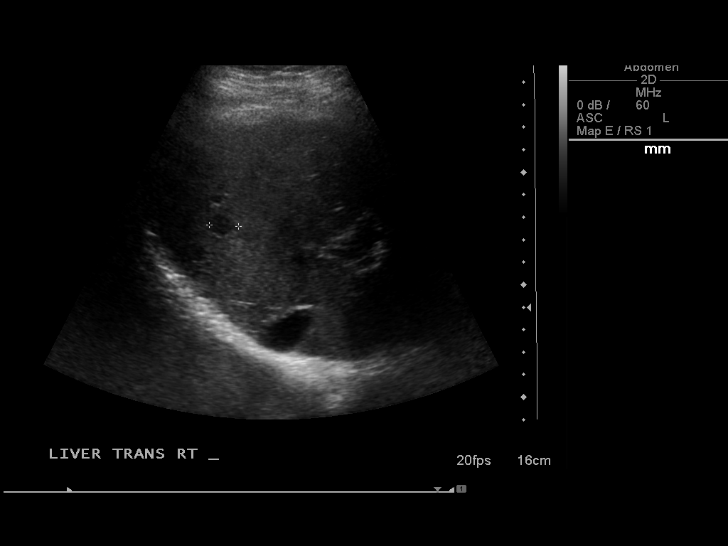
[im 5/22]
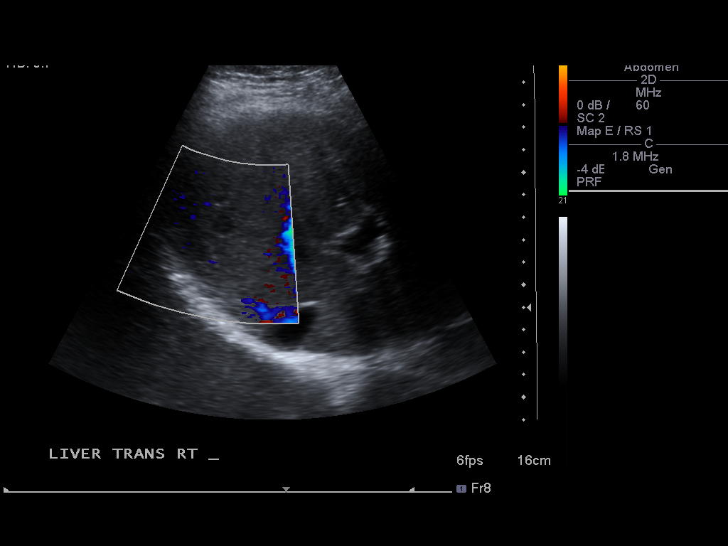
[im 6/22]
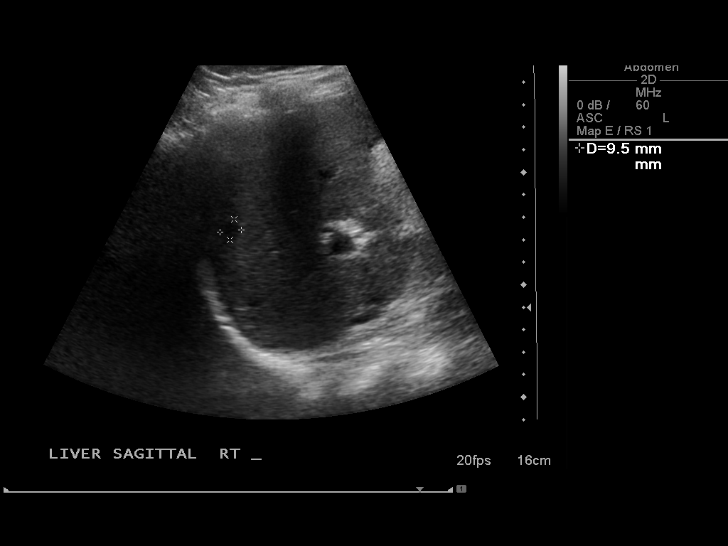
[im 8/22]
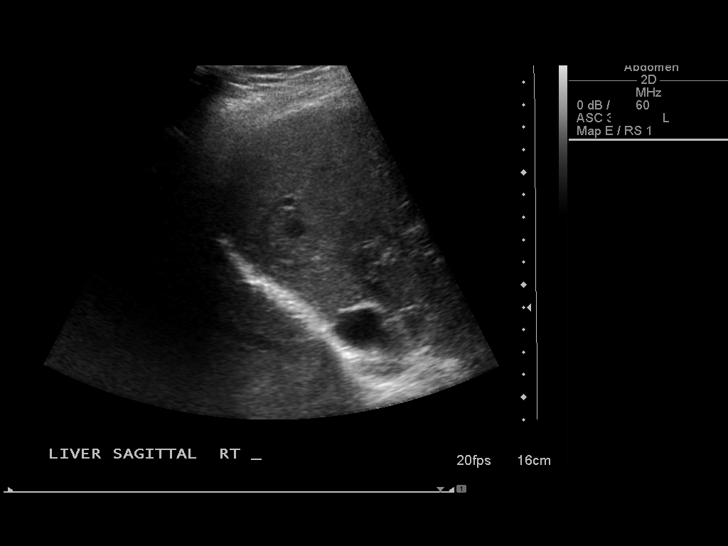
[im 10/22]
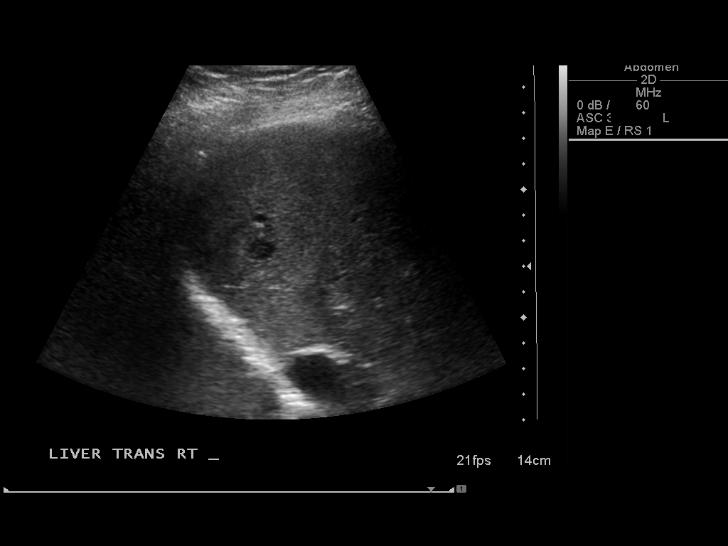
[im 12/22]
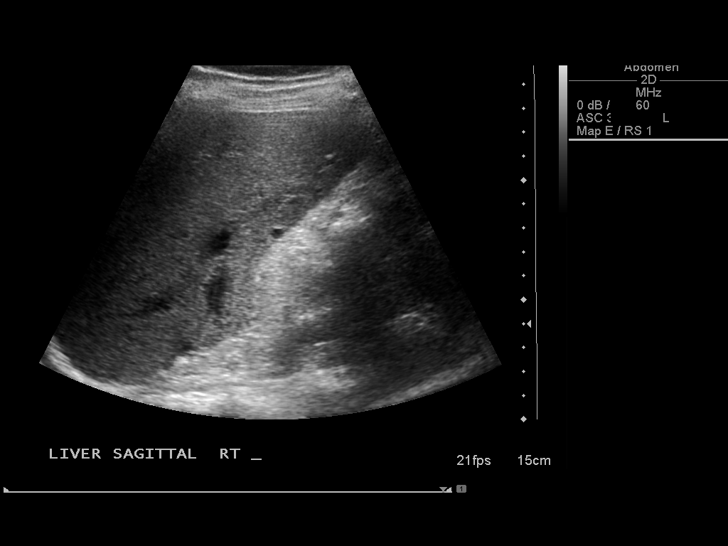
[im 13/22]
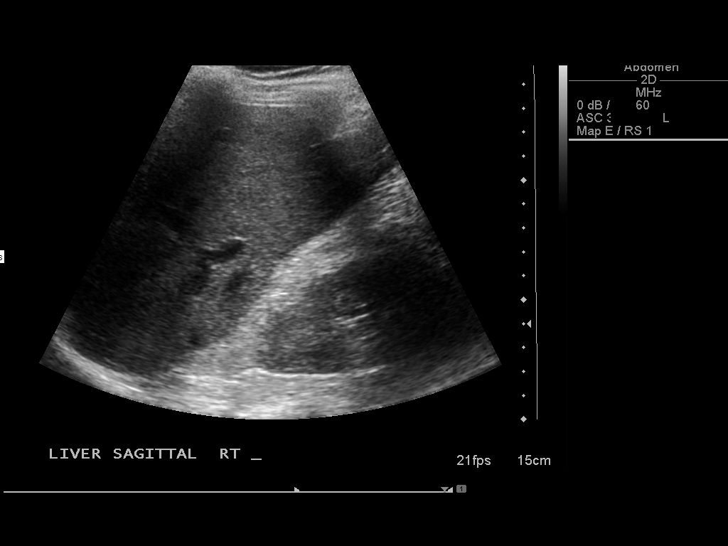
[im 15/22]
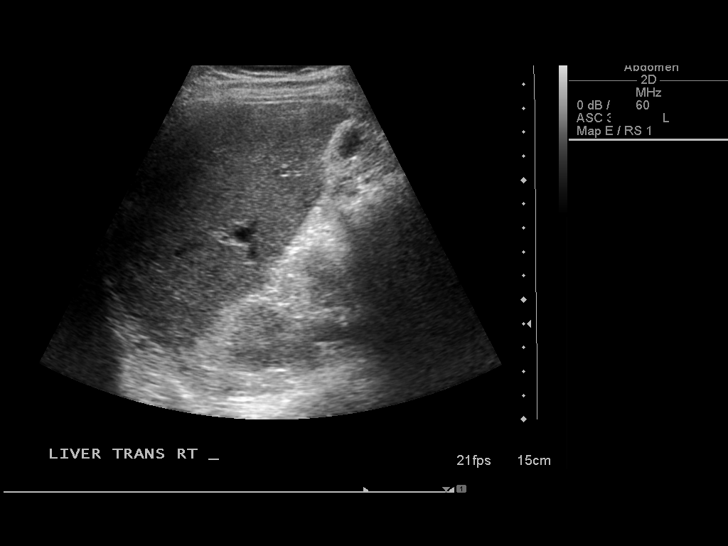
[im 17/22]
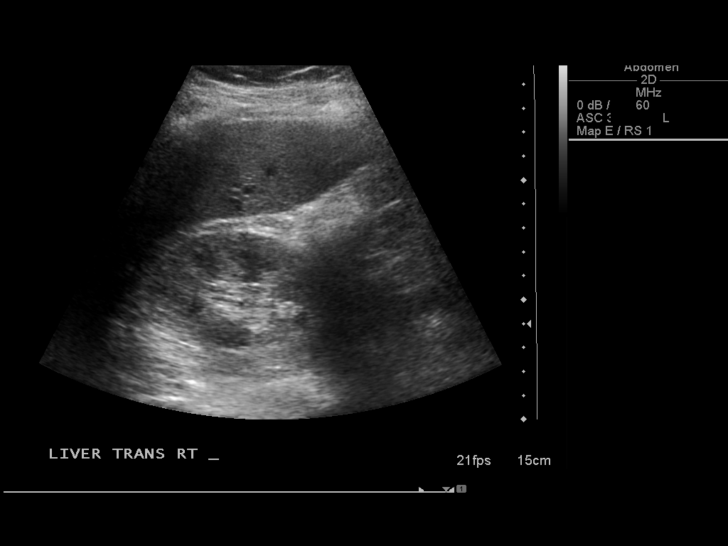
[im 18/22]
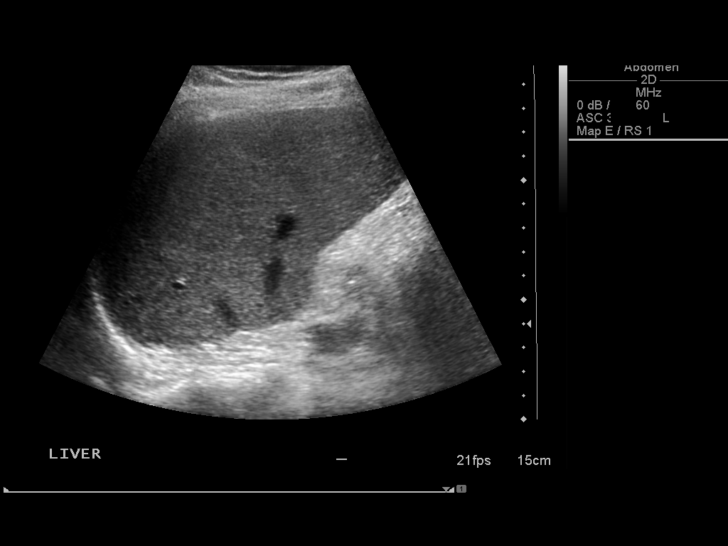
[im 20/22]
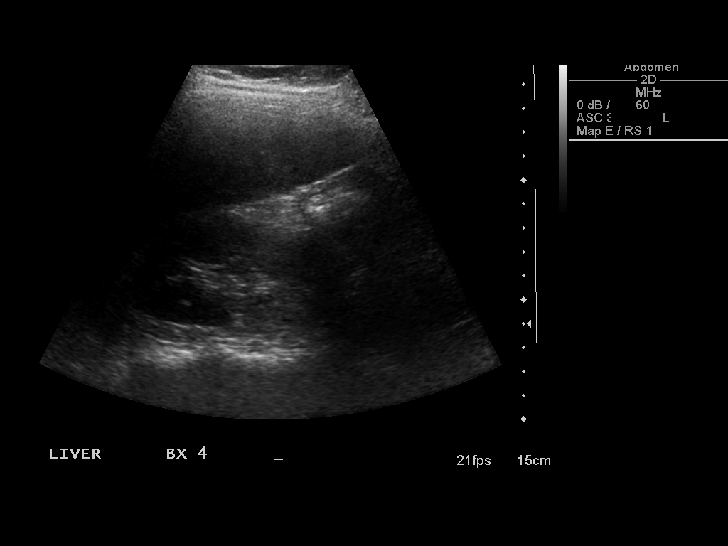
[im 22/22]
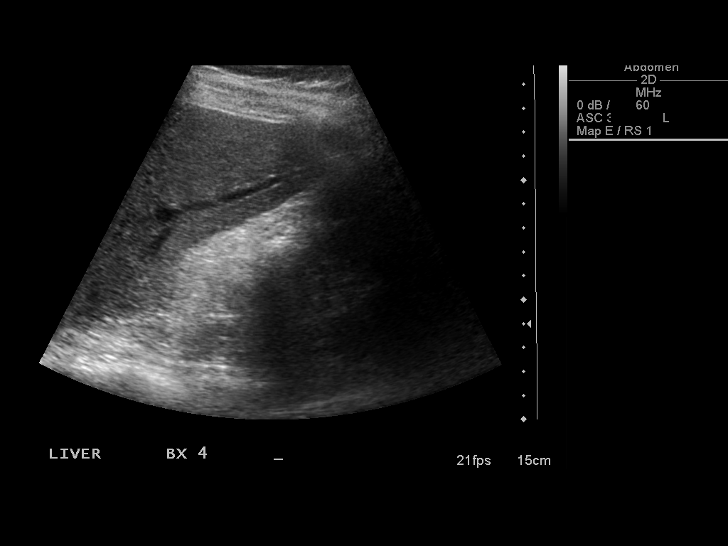

[13 of 22 positions shown; findings below may reference images not displayed]

IMPRESSION: 1. Technically successful ultrasound guided random liver core
biopsy with moderate sedation as described above.
2.  Indeterminate approximately 1.3 cm hypoechoic lesion within the
dome of the right lobe the liver, not definitely seen on
preprocedural abdominal CT.  Further evaluation of this lesion may
be performed with a contrast enhanced abdominal MRI as clinically
indicated.

Read by: [place], [place].-CHINNAIYA

## 2012-08-14 ENCOUNTER — Encounter: Payer: Self-pay | Admitting: Internal Medicine

## 2012-08-14 ENCOUNTER — Ambulatory Visit (INDEPENDENT_AMBULATORY_CARE_PROVIDER_SITE_OTHER): Payer: 59 | Admitting: Internal Medicine

## 2012-08-14 VITALS — BP 90/60 | HR 80 | Ht 67.82 in | Wt 183.1 lb

## 2012-08-14 DIAGNOSIS — E559 Vitamin D deficiency, unspecified: Secondary | ICD-10-CM

## 2012-08-14 DIAGNOSIS — Z79899 Other long term (current) drug therapy: Secondary | ICD-10-CM

## 2012-08-14 DIAGNOSIS — K754 Autoimmune hepatitis: Secondary | ICD-10-CM

## 2012-08-14 MED ORDER — AZATHIOPRINE 50 MG PO TABS: 50.0000 mg | ORAL_TABLET | Freq: Every day | ORAL | Status: DC

## 2012-08-14 MED ORDER — VITAMIN D3 25 MCG (1000 UT) PO CAPS: 1.0000 | ORAL_CAPSULE | Freq: Every day | ORAL | Status: AC

## 2012-08-14 NOTE — Assessment & Plan Note (Signed)
Level 39 after 8 weeks 50K U/week Start 1000 IU daily

## 2012-08-14 NOTE — Patient Instructions (Addendum)
We have sent the following medications to your pharmacy for you to pick up at your convenience: Generic Imuran  Purchase Vitamin D 1000 IU over the counter.  Take one daily.  Follow up with [place] in 4 months and get your lab work done prior to your appointment .  The orders are in the computer and no appointment needed.   I appreciate the opportunity to care for you.

## 2012-08-14 NOTE — Progress Notes (Signed)
  Subjective:    Patient ID: Michael West, male    DOB: [birth year], 42 y.o.   MRN: [id number]  HPI  He is here with interpreter. CBC and LFT's were normal except slightly low Hgb Wt Readings from Last 3 Encounters:  08/14/12 183 lb 2 oz (83.065 kg)  05/25/12 189 lb (85.73 kg)  05/22/12 185 lb (83.915 kg)    Medications, allergies, past medical history, past surgical history, family history and social history are reviewed and updated in the EMR.   Review of Systems light-headed in AM x 1 week - no other asociated sxs but some right shoulder pain    Objective:   Physical Exam  General:  NAD Eyes:   Anicteric Lungs  clear Cor   S1S2 no murmur Abdomen:  soft and nontender, BS+ Ext:   no edema  Data Reviewed:  Lab Results  Component Value Date   WBC 6.1 08/07/2012   HGB 12.9* 08/07/2012   HCT 38.1* 08/07/2012   MCV 93.0 08/07/2012   PLT 203.0 08/07/2012   Lab Results  Component Value Date   ALT 15 08/07/2012   AST 30 08/07/2012   ALKPHOS 77 08/07/2012   BILITOT 0.5 08/07/2012   Vit d 39     Assessment & Plan:  Autoimmune hepatitis - Plan: azaTHIOprine (IMURAN) 50 MG tablet  Unspecified vitamin D deficiency - Plan: Cholecalciferol (VITAMIN D3) 1000 UNITS CAPS  Long-term use of immunosuppressant medication - azathioprine

## 2012-08-14 NOTE — Assessment & Plan Note (Signed)
Doing well - no signs of toxicity Influenza vaccine upon return

## 2012-08-14 NOTE — Assessment & Plan Note (Signed)
Doing well LFT's normal REV late October with labs before

## 2012-11-20 ENCOUNTER — Other Ambulatory Visit (INDEPENDENT_AMBULATORY_CARE_PROVIDER_SITE_OTHER): Payer: 59

## 2012-11-20 DIAGNOSIS — Z79899 Other long term (current) drug therapy: Secondary | ICD-10-CM

## 2012-11-20 DIAGNOSIS — K754 Autoimmune hepatitis: Secondary | ICD-10-CM

## 2012-11-20 LAB — CBC WITH DIFFERENTIAL/PLATELET
Basophils Relative: 0.4 % (ref 0.0–3.0)
Eosinophils Relative: 1.4 % (ref 0.0–5.0)
HCT: 37 % — ABNORMAL LOW (ref 39.0–52.0)
Hemoglobin: 12.7 g/dL — ABNORMAL LOW (ref 13.0–17.0)
MCV: 91 fl (ref 78.0–100.0)
Monocytes Absolute: 0.3 10*3/uL (ref 0.1–1.0)
Neutro Abs: 3.9 10*3/uL (ref 1.4–7.7)
Neutrophils Relative %: 66 % (ref 43.0–77.0)
RBC: 4.06 Mil/uL — ABNORMAL LOW (ref 4.22–5.81)
WBC: 5.9 10*3/uL (ref 4.5–10.5)

## 2012-11-20 LAB — HEPATIC FUNCTION PANEL
ALT: 10 U/L (ref 0–53)
Albumin: 4.1 g/dL (ref 3.5–5.2)
Bilirubin, Direct: 0 mg/dL (ref 0.0–0.3)
Total Protein: 8 g/dL (ref 6.0–8.3)

## 2012-12-06 ENCOUNTER — Ambulatory Visit (INDEPENDENT_AMBULATORY_CARE_PROVIDER_SITE_OTHER): Payer: 59 | Admitting: Internal Medicine

## 2012-12-06 ENCOUNTER — Encounter: Payer: Self-pay | Admitting: Internal Medicine

## 2012-12-06 VITALS — BP 102/74 | HR 60 | Ht 67.8 in | Wt 188.4 lb

## 2012-12-06 DIAGNOSIS — Z79899 Other long term (current) drug therapy: Secondary | ICD-10-CM

## 2012-12-06 DIAGNOSIS — Z23 Encounter for immunization: Secondary | ICD-10-CM

## 2012-12-06 DIAGNOSIS — K754 Autoimmune hepatitis: Secondary | ICD-10-CM

## 2012-12-06 NOTE — Assessment & Plan Note (Addendum)
Doing well - NL LFT's - continue AZA REV 6 months - consider repeat liver bx then Labs in 3 months - CBC and CMEt Flu vaccine today

## 2012-12-06 NOTE — Assessment & Plan Note (Addendum)
Flu vaccine today continue checking LFTs and CBC every 3 months.

## 2012-12-06 NOTE — Progress Notes (Signed)
  Subjective:    Patient ID: Michael West, male    DOB: [birth year], 42 y.o.   MRN: [id number]  HPI The patient is here for followup of his autoimmune hepatitis. He continues to do well, his liver tests are normal. He has a slight anemia. This is not new. He is on azathioprine 50 mg daily. He has no complaints today. He is continuing to work about 4 days a week at his job.   Review of Systems Ankle pain is gone, he has not had other illness aware of    Objective:   Physical Exam General:  NAD Eyes:   anicteric Lungs:  clear Heart:  S1S2 no rubs, murmurs or gallops Abdomen:  soft and nontender, BS+, no hepatosplenomegaly or mass Ext:  No edema    Data Reviewed:  Lab Results  Component Value Date   WBC 5.9 11/20/2012   HGB 12.7* 11/20/2012   HCT 37.0* 11/20/2012   MCV 91.0 11/20/2012   PLT 194.0 11/20/2012      Chemistry      Component Value Date/Time   NA 138 03/27/2012 0937   K 4.1 03/27/2012 0937   CL 103 03/27/2012 0937   CO2 28 03/27/2012 0937   BUN 11 03/27/2012 0937   CREATININE 0.8 03/27/2012 0937   CREATININE 0.72 08/17/2011 0911      Component Value Date/Time   CALCIUM 9.3 03/27/2012 0937   ALKPHOS 82 11/20/2012 1425   AST 23 11/20/2012 1425   ALT 10 11/20/2012 1425   BILITOT 0.5 11/20/2012 1425        Assessment & Plan:   1. Autoimmune hepatitis   2. Long-term use of immunosuppressant medication - azathioprine

## 2012-12-06 NOTE — Patient Instructions (Signed)
Today you have been given a flu vaccine to protect you for the 2014-2015 flu season.  Please come and get labs done in January 2015, orders will be in the system.  Follow up with [place] in 6 months.  I appreciate the opportunity to care for you.

## 2013-01-08 ENCOUNTER — Telehealth: Payer: Self-pay | Admitting: Internal Medicine

## 2013-01-08 DIAGNOSIS — K754 Autoimmune hepatitis: Secondary | ICD-10-CM

## 2013-01-08 MED ORDER — AZATHIOPRINE 50 MG PO TABS: 50.0000 mg | ORAL_TABLET | Freq: Every day | ORAL | Status: DC

## 2013-01-08 NOTE — Telephone Encounter (Signed)
Refill sent in as requested and tried to notify patient but his voice mail box has not been set up yet.

## 2013-02-20 ENCOUNTER — Other Ambulatory Visit (INDEPENDENT_AMBULATORY_CARE_PROVIDER_SITE_OTHER): Payer: 59

## 2013-02-20 DIAGNOSIS — K754 Autoimmune hepatitis: Secondary | ICD-10-CM

## 2013-02-20 DIAGNOSIS — Z7960 Long term (current) use of unspecified immunomodulators and immunosuppressants: Secondary | ICD-10-CM

## 2013-02-20 DIAGNOSIS — Z79899 Other long term (current) drug therapy: Secondary | ICD-10-CM

## 2013-02-20 LAB — COMPREHENSIVE METABOLIC PANEL
ALBUMIN: 4.3 g/dL (ref 3.5–5.2)
ALK PHOS: 87 U/L (ref 39–117)
ALT: 12 U/L (ref 0–53)
AST: 23 U/L (ref 0–37)
BUN: 12 mg/dL (ref 6–23)
CALCIUM: 9.4 mg/dL (ref 8.4–10.5)
CHLORIDE: 103 meq/L (ref 96–112)
CO2: 29 mEq/L (ref 19–32)
Creatinine, Ser: 0.8 mg/dL (ref 0.4–1.5)
GFR: 114.62 mL/min (ref >60.00)
Glucose, Bld: 93 mg/dL (ref 70–99)
POTASSIUM: 4.4 meq/L (ref 3.5–5.1)
SODIUM: 138 meq/L (ref 135–145)
Total Bilirubin: 0.5 mg/dL (ref 0.3–1.2)
Total Protein: 8.2 g/dL (ref 6.0–8.3)

## 2013-02-20 LAB — CBC WITH DIFFERENTIAL/PLATELET
Basophils Absolute: 0 K/uL (ref 0.0–0.1)
Basophils Relative: 0.3 % (ref 0.0–3.0)
Eosinophils Absolute: 0.1 K/uL (ref 0.0–0.7)
Eosinophils Relative: 1.2 % (ref 0.0–5.0)
HCT: 37.6 % — ABNORMAL LOW (ref 39.0–52.0)
Hemoglobin: 12.9 g/dL — ABNORMAL LOW (ref 13.0–17.0)
Lymphocytes Relative: 28.6 % (ref 12.0–46.0)
Lymphs Abs: 1.9 K/uL (ref 0.7–4.0)
MCHC: 34.4 g/dL (ref 30.0–36.0)
MCV: 89.8 fl (ref 78.0–100.0)
Monocytes Absolute: 0.4 K/uL (ref 0.1–1.0)
Monocytes Relative: 6.2 % (ref 3.0–12.0)
Neutro Abs: 4.2 K/uL (ref 1.4–7.7)
Neutrophils Relative %: 63.7 % (ref 43.0–77.0)
Platelets: 193 K/uL (ref 150.0–400.0)
RBC: 4.19 Mil/uL — ABNORMAL LOW (ref 4.22–5.81)
RDW: 14.2 % (ref 11.5–14.6)
WBC: 6.6 K/uL (ref 4.5–10.5)

## 2013-02-21 NOTE — Progress Notes (Signed)
Quick Note:  Please send a letter that las are stable and ok. Tell him to have blood work (CBC and LFT) again in 3 months and to schedule an appointment for 1 week after labs ______

## 2013-02-22 ENCOUNTER — Other Ambulatory Visit: Payer: Self-pay

## 2013-02-22 DIAGNOSIS — K754 Autoimmune hepatitis: Secondary | ICD-10-CM

## 2013-04-22 ENCOUNTER — Other Ambulatory Visit (INDEPENDENT_AMBULATORY_CARE_PROVIDER_SITE_OTHER): Payer: 59

## 2013-04-22 DIAGNOSIS — K754 Autoimmune hepatitis: Secondary | ICD-10-CM

## 2013-04-22 LAB — HEPATIC FUNCTION PANEL
ALBUMIN: 4.2 g/dL (ref 3.5–5.2)
ALT: 17 U/L (ref 0–53)
AST: 26 U/L (ref 0–37)
Alkaline Phosphatase: 81 U/L (ref 39–117)
BILIRUBIN DIRECT: 0.1 mg/dL (ref 0.0–0.3)
TOTAL PROTEIN: 7.9 g/dL (ref 6.0–8.3)
Total Bilirubin: 0.6 mg/dL (ref 0.3–1.2)

## 2013-04-22 LAB — CBC WITH DIFFERENTIAL/PLATELET
BASOS ABS: 0 10*3/uL (ref 0.0–0.1)
Basophils Relative: 0.3 % (ref 0.0–3.0)
EOS ABS: 0.1 10*3/uL (ref 0.0–0.7)
Eosinophils Relative: 1.6 % (ref 0.0–5.0)
HCT: 37.1 % — ABNORMAL LOW (ref 39.0–52.0)
Hemoglobin: 12.6 g/dL — ABNORMAL LOW (ref 13.0–17.0)
LYMPHS PCT: 35 % (ref 12.0–46.0)
Lymphs Abs: 1.9 10*3/uL (ref 0.7–4.0)
MCHC: 33.9 g/dL (ref 30.0–36.0)
MCV: 92.3 fl (ref 78.0–100.0)
Monocytes Absolute: 0.4 10*3/uL (ref 0.1–1.0)
Monocytes Relative: 7.7 % (ref 3.0–12.0)
NEUTROS ABS: 3 10*3/uL (ref 1.4–7.7)
NEUTROS PCT: 55.4 % (ref 43.0–77.0)
Platelets: 201 10*3/uL (ref 150.0–400.0)
RBC: 4.02 Mil/uL — ABNORMAL LOW (ref 4.22–5.81)
RDW: 14 % (ref 11.5–14.6)
WBC: 5.4 10*3/uL (ref 4.5–10.5)

## 2013-04-22 NOTE — Progress Notes (Signed)
Quick Note:  Send him a letter that labs are ok Will see him in May ______

## 2013-05-19 ENCOUNTER — Other Ambulatory Visit: Payer: Self-pay | Admitting: Internal Medicine

## 2013-05-21 ENCOUNTER — Telehealth: Payer: Self-pay | Admitting: Internal Medicine

## 2013-05-21 NOTE — Telephone Encounter (Signed)
Azathioprine rx sent yesterday, pt informed.

## 2013-06-11 ENCOUNTER — Encounter: Payer: Self-pay | Admitting: Internal Medicine

## 2013-06-11 ENCOUNTER — Ambulatory Visit (INDEPENDENT_AMBULATORY_CARE_PROVIDER_SITE_OTHER): Payer: 59 | Admitting: Internal Medicine

## 2013-06-11 VITALS — BP 116/80 | HR 56 | Ht 67.82 in | Wt 189.2 lb

## 2013-06-11 DIAGNOSIS — Z79899 Other long term (current) drug therapy: Secondary | ICD-10-CM

## 2013-06-11 DIAGNOSIS — Z7960 Long term (current) use of unspecified immunomodulators and immunosuppressants: Secondary | ICD-10-CM

## 2013-06-11 DIAGNOSIS — K754 Autoimmune hepatitis: Secondary | ICD-10-CM

## 2013-06-11 NOTE — Patient Instructions (Signed)
You will be contacted about doing a Liver Biopsy by the hospital.   Please come and get labs drawn in early July, lab hours are 7:30AM-5:00pm.   I appreciate the opportunity to care for you.

## 2013-06-11 NOTE — Assessment & Plan Note (Signed)
No problems 

## 2013-06-11 NOTE — Progress Notes (Signed)
         Subjective:    Patient ID: Michael West, male    DOB: May 31, 1970, 43 y.o.   MRN: [id number]  HPI Michael West is a with an interpreter. He reports no problems, he is here for followup of autoimmune hepatitis.  Medications, allergies, past medical history, past surgical history, family history and social history are reviewed and updated in the EMR.  Review of Systems As above    Objective:   Physical Exam General:  NAD, Asian man Eyes:   anicteric Lungs:  clear Heart:  S1S2 no rubs, murmurs or gallops Abdomen:  soft and nontender, BS+, no HSM/mass Ext:   no edema Skin:  No stigmata chronic liver disease    Data Reviewed:  Labs flowed in the EMR     Assessment & Plan:  Autoimmune hepatitis Seems to be in remission Will repeat a liver bx Anticipate repeat REV 6 months Will notify re: liver bx and next labs by mail - he says letters work for this.  Long-term use of immunosuppressant medication - azathioprine No problems

## 2013-06-11 NOTE — Assessment & Plan Note (Signed)
Seems to be in remission Will repeat a liver bx Anticipate repeat REV 6 months Will notify re: liver bx and next labs by mail - he says letters work for this.

## 2013-06-20 ENCOUNTER — Other Ambulatory Visit: Payer: Self-pay | Admitting: Internal Medicine

## 2013-06-20 ENCOUNTER — Other Ambulatory Visit: Payer: Self-pay | Admitting: Radiology

## 2013-06-20 NOTE — Telephone Encounter (Signed)
Please advise how many refills Sir, thank you. 

## 2013-06-21 NOTE — Telephone Encounter (Signed)
4 refills please 

## 2013-06-24 ENCOUNTER — Ambulatory Visit (HOSPITAL_COMMUNITY): Admission: RE | Admit: 2013-06-24 | Payer: 59 | Source: Ambulatory Visit

## 2013-06-28 ENCOUNTER — Other Ambulatory Visit: Payer: Self-pay | Admitting: Radiology

## 2013-07-02 ENCOUNTER — Other Ambulatory Visit: Payer: Self-pay | Admitting: Radiology

## 2013-07-04 ENCOUNTER — Encounter (HOSPITAL_COMMUNITY): Payer: Self-pay

## 2013-07-04 ENCOUNTER — Ambulatory Visit (HOSPITAL_COMMUNITY)
Admission: RE | Admit: 2013-07-04 | Discharge: 2013-07-04 | Disposition: A | Discharge status: Home / Self Care | Payer: 59 | Source: Ambulatory Visit | Attending: Internal Medicine | Admitting: Internal Medicine

## 2013-07-04 DIAGNOSIS — Z23 Encounter for immunization: Secondary | ICD-10-CM | POA: Insufficient documentation

## 2013-07-04 DIAGNOSIS — K754 Autoimmune hepatitis: Secondary | ICD-10-CM | POA: Insufficient documentation

## 2013-07-04 LAB — CBC
HCT: 37.4 % — ABNORMAL LOW (ref 39.0–52.0)
Hemoglobin: 12.1 g/dL — ABNORMAL LOW (ref 13.0–17.0)
MCH: 30.6 pg (ref 26.0–34.0)
MCHC: 32.4 g/dL (ref 30.0–36.0)
MCV: 94.7 fL (ref 78.0–100.0)
Platelets: 181 10*3/uL (ref 150–400)
RBC: 3.95 MIL/uL — ABNORMAL LOW (ref 4.22–5.81)
RDW: 13.5 % (ref 11.5–15.5)
WBC: 3.9 10*3/uL — ABNORMAL LOW (ref 4.0–10.5)

## 2013-07-04 LAB — PROTIME-INR
INR: 1 (ref 0.00–1.49)
PROTHROMBIN TIME: 13 s (ref 11.6–15.2)

## 2013-07-04 LAB — APTT: aPTT: 32 seconds (ref 24–37)

## 2013-07-04 MED ORDER — MIDAZOLAM HCL 2 MG/2ML IJ SOLN
INTRAMUSCULAR | Status: AC
  Filled 2013-07-04: qty 6

## 2013-07-04 MED ORDER — FENTANYL CITRATE 0.05 MG/ML IJ SOLN
INTRAMUSCULAR | Status: AC
  Filled 2013-07-04: qty 4

## 2013-07-04 MED ORDER — HYDROCODONE-ACETAMINOPHEN 5-325 MG PO TABS: 1.0000 | ORAL_TABLET | ORAL | Status: DC | PRN

## 2013-07-04 MED ORDER — FENTANYL CITRATE 0.05 MG/ML IJ SOLN
INTRAMUSCULAR | Status: AC | PRN
  Administered 2013-07-04 (×2): 50 ug via INTRAVENOUS

## 2013-07-04 MED ORDER — SODIUM CHLORIDE 0.9 % IV SOLN
Freq: Once | INTRAVENOUS | Status: AC
  Administered 2013-07-04: 10:00:00 via INTRAVENOUS

## 2013-07-04 MED ORDER — MIDAZOLAM HCL 2 MG/2ML IJ SOLN
INTRAMUSCULAR | Status: AC | PRN
  Administered 2013-07-04 (×2): 1 mg via INTRAVENOUS

## 2013-07-04 NOTE — Discharge Instructions (Signed)
Liver Biopsy  Care After  These instructions give you information on caring for yourself after your procedure. Your doctor may also give you more specific instructions. Call your doctor if you have any problems or questions after your procedure.  HOME CARE  · Watch for bleeding at your biopsy site.  · No heavy lifting, pushing, or pulling for 48 hours (2 days).  · No exercise, jogging, or sex for 48 hours (2 days).  · Do not drive or use heavy machinery for 24 hours (1 day).  · Go back to your usual diet and medicines as told by your doctor.  · Do not take the bandage off until the next morning.  · Only take medicine as told by your doctor.  · Do not shower or bathe until the next day.  GET HELP RIGHT AWAY IF:  · You have shortness of breath or trouble breathing.  · You have pain or cramping in your belly (abdomen).  · You feel sick to your stomach (nauseous) or throw up (vomit).  · Bleeding does not stop from the place where the needle was put in. Press on the place that is bleeding until you are checked in the Emergency Room.  · Yellowish white fluid (pus) is coming from the place where the needle was put in.  · You have any unusual pain that will not stop.  · You have puffiness (swelling) or redness at the place where the needle was put in, or if the place is very sore or hot when you touch it.  · You have a fever of more than 102° F (38.9° C) for 2 or more days.  · You have black, smelly poops (bowel movements).  If you go to the Emergency Room, tell the nurse that you had a liver biopsy. Take this paper with you and show it to the nurse. Keep your follow-up appointment.  MAKE SURE YOU:  · Understand these instructions.  · Will watch your condition.  · Will get help right away if you are not doing well or get worse.  Document Released: 11/03/2007 Document Revised: 04/18/2011 Document Reviewed: 11/03/2007  ExitCare® Patient Information ©2014 [employer].

## 2013-07-04 NOTE — Procedures (Signed)
Random liver core Bx

## 2013-07-04 NOTE — H&P (Signed)
Chief Complaint: "I am here for a liver biopsy." Referring Physician: Dr. Leone Payor HPI: Michael West is an 43 y.o. male with autoimmune hepatitis on Imuran. Last liver biopsy 08/2011. Patient follows with Dr. Leone Payor and LFT's have normalized, request for image guided random liver biopsy today. He denies any chest pain, shortness of breath or palpitations. He denies any active signs of bleeding or excessive bruising. He denies any recent fever or chills. The patient denies any history of sleep apnea or chronic oxygen use. He has previously tolerated sedation without complications.   Past Medical History:  Past Medical History  Diagnosis Date  . Autoimmune hepatitis   . Unspecified vitamin D deficiency 03/30/2012    .03/2012 - Level is 14    Past Surgical History:  Past Surgical History  Procedure Laterality Date  . Proctoscopy  12/06/2000    Internal and external hemorrhoidectomy  . Hemorrhoid surgery  12/06/2000  . Dental prosthesis      Family History:  Family History  Problem Relation Age of Onset  . Colon cancer Mother     Social History:  reports that he has never smoked. He has never used smokeless tobacco. He reports that he does not drink alcohol or use illicit drugs.  Allergies: No Known Allergies  Medications:   Medication List    ASK your doctor about these medications       azaTHIOprine 50 MG tablet  Commonly known as:  IMURAN  Take 50 mg by mouth daily.     Vitamin D3 1000 UNITS Caps  Take 1 capsule (1,000 Units total) by mouth daily.       Please HPI for pertinent positives, otherwise complete 10 system ROS negative.  Physical Exam: BP 123/73  Pulse 50  Temp(Src) 98 F (36.7 C) (Oral)  Resp 20  Ht 5\' 10"  (1.778 m)  Wt 170 lb (77.111 kg)  BMI 24.39 kg/m2  SpO2 99% Body mass index is 24.39 kg/(m^2).  General Appearance:  Alert, cooperative, no distress  Head:  Normocephalic, without obvious abnormality, atraumatic  Neck: Supple, symmetrical,  trachea midline  Lungs:   Clear to auscultation bilaterally, no w/r/r, respirations unlabored without use of accessory muscles.  Chest Wall:  No tenderness or deformity  Heart:  Regular rate and rhythm, S1, S2 normal, no murmur, rub or gallop.  Abdomen:   Soft, non-tender, non distended, (+) BS  Extremities: Extremities normal, atraumatic, no cyanosis or edema  Neurologic: Normal affect, no gross deficits.   Results for orders placed during the hospital encounter of 07/04/13 (from the past 48 hour(s))  APTT     Status: None   Collection Time    07/04/13  9:21 AM      Result Value Ref Range   aPTT 32  24 - 37 seconds  CBC     Status: Abnormal   Collection Time    07/04/13  9:21 AM      Result Value Ref Range   WBC 3.9 (*) 4.0 - 10.5 K/uL   RBC 3.95 (*) 4.22 - 5.81 MIL/uL   Hemoglobin 12.1 (*) 13.0 - 17.0 g/dL   HCT 60.1 (*) 09.3 - 23.5 %   MCV 94.7  78.0 - 100.0 fL   MCH 30.6  26.0 - 34.0 pg   MCHC 32.4  30.0 - 36.0 g/dL   RDW 57.3  22.0 - 25.4 %   Platelets 181  150 - 400 K/uL  PROTIME-INR     Status: None   Collection Time  07/04/13  9:21 AM      Result Value Ref Range   Prothrombin Time 13.0  11.6 - 15.2 seconds   INR 1.00  0.00 - 1.49   No results found.  Assessment/Plan Autoimmune hepatitis on Imuran, last liver biopsy 08/2011 Request for image guided random liver biopsy, LFT's improved, questionable remission.  Patient has been NPO, no blood thinners taken, labs reviewed Risks and Benefits discussed with the patient with the use of a interpreter. All of the patient's questions were answered, patient is agreeable to proceed. Consent signed and in chart.    Berneta LevinsKoreen D Kalicia Dufresne PA-C 07/04/2013, 10:12 AM

## 2013-07-16 ENCOUNTER — Encounter: Payer: Self-pay | Admitting: Internal Medicine

## 2013-08-22 ENCOUNTER — Other Ambulatory Visit (INDEPENDENT_AMBULATORY_CARE_PROVIDER_SITE_OTHER): Payer: 59

## 2013-08-22 ENCOUNTER — Encounter: Payer: Self-pay | Admitting: Internal Medicine

## 2013-08-22 DIAGNOSIS — K754 Autoimmune hepatitis: Secondary | ICD-10-CM

## 2013-08-22 LAB — HEPATIC FUNCTION PANEL
ALBUMIN: 4.1 g/dL (ref 3.5–5.2)
ALT: 14 U/L (ref 0–53)
AST: 25 U/L (ref 0–37)
Alkaline Phosphatase: 85 U/L (ref 39–117)
Bilirubin, Direct: 0.1 mg/dL (ref 0.0–0.3)
TOTAL PROTEIN: 7.6 g/dL (ref 6.0–8.3)
Total Bilirubin: 0.3 mg/dL (ref 0.2–1.2)

## 2013-08-22 LAB — CBC WITH DIFFERENTIAL/PLATELET
Basophils Absolute: 0 10*3/uL (ref 0.0–0.1)
Basophils Relative: 0.3 % (ref 0.0–3.0)
EOS ABS: 0.1 10*3/uL (ref 0.0–0.7)
Eosinophils Relative: 1.6 % (ref 0.0–5.0)
HEMATOCRIT: 34.1 % — AB (ref 39.0–52.0)
HEMOGLOBIN: 11.6 g/dL — AB (ref 13.0–17.0)
LYMPHS ABS: 1.8 10*3/uL (ref 0.7–4.0)
Lymphocytes Relative: 31.1 % (ref 12.0–46.0)
MCHC: 34.1 g/dL (ref 30.0–36.0)
MCV: 92.4 fl (ref 78.0–100.0)
Monocytes Absolute: 0.3 10*3/uL (ref 0.1–1.0)
Monocytes Relative: 5.9 % (ref 3.0–12.0)
NEUTROS ABS: 3.5 10*3/uL (ref 1.4–7.7)
Neutrophils Relative %: 61.1 % (ref 43.0–77.0)
Platelets: 190 10*3/uL (ref 150.0–400.0)
RBC: 3.69 Mil/uL — ABNORMAL LOW (ref 4.22–5.81)
RDW: 14 % (ref 11.5–15.5)
WBC: 5.7 10*3/uL (ref 4.0–10.5)

## 2013-08-22 NOTE — Progress Notes (Signed)
Quick Note:  Needs letter that labs are ok and that needs to see me in Nov Labs before that visit (few days ) ______

## 2013-10-22 ENCOUNTER — Other Ambulatory Visit: Payer: Self-pay | Admitting: Internal Medicine

## 2013-11-26 ENCOUNTER — Encounter: Payer: Self-pay | Admitting: Internal Medicine

## 2013-11-27 ENCOUNTER — Other Ambulatory Visit: Payer: Self-pay | Admitting: Gastroenterology

## 2013-12-29 ENCOUNTER — Other Ambulatory Visit: Payer: Self-pay | Admitting: Internal Medicine

## 2013-12-30 ENCOUNTER — Other Ambulatory Visit: Payer: Self-pay

## 2013-12-30 DIAGNOSIS — K754 Autoimmune hepatitis: Secondary | ICD-10-CM

## 2013-12-30 NOTE — Telephone Encounter (Signed)
Refill x 1  Needs to get labs done also and will refill further when checked

## 2013-12-30 NOTE — Telephone Encounter (Signed)
Patient has appointment Jan. 19th with you.  You had wanted labs /office visit in Nov.  Ok to wait till Jan. For his labs (CBC, Hepatic functions) ?  Ok to refill Sir?

## 2013-12-30 NOTE — Telephone Encounter (Signed)
[facility] is putting a note on his rx to come get labs since I was unable to reach him.

## 2014-02-25 ENCOUNTER — Other Ambulatory Visit (INDEPENDENT_AMBULATORY_CARE_PROVIDER_SITE_OTHER): Payer: 59

## 2014-02-25 ENCOUNTER — Encounter: Payer: Self-pay | Admitting: Internal Medicine

## 2014-02-25 ENCOUNTER — Ambulatory Visit (INDEPENDENT_AMBULATORY_CARE_PROVIDER_SITE_OTHER): Payer: 59 | Admitting: Internal Medicine

## 2014-02-25 VITALS — BP 118/78 | HR 86 | Ht 68.5 in | Wt 198.6 lb

## 2014-02-25 DIAGNOSIS — K754 Autoimmune hepatitis: Secondary | ICD-10-CM

## 2014-02-25 DIAGNOSIS — Z79899 Other long term (current) drug therapy: Secondary | ICD-10-CM

## 2014-02-25 DIAGNOSIS — Z7960 Long term (current) use of unspecified immunomodulators and immunosuppressants: Secondary | ICD-10-CM

## 2014-02-25 LAB — CBC WITH DIFFERENTIAL/PLATELET
BASOS PCT: 0.3 % (ref 0.0–3.0)
Basophils Absolute: 0 10*3/uL (ref 0.0–0.1)
Eosinophils Absolute: 0.1 10*3/uL (ref 0.0–0.7)
Eosinophils Relative: 1.5 % (ref 0.0–5.0)
HEMATOCRIT: 38.6 % — AB (ref 39.0–52.0)
Hemoglobin: 12.6 g/dL — ABNORMAL LOW (ref 13.0–17.0)
LYMPHS ABS: 1.7 10*3/uL (ref 0.7–4.0)
Lymphocytes Relative: 30.2 % (ref 12.0–46.0)
MCHC: 32.7 g/dL (ref 30.0–36.0)
MCV: 90.8 fl (ref 78.0–100.0)
MONO ABS: 0.6 10*3/uL (ref 0.1–1.0)
Monocytes Relative: 10.6 % (ref 3.0–12.0)
NEUTROS ABS: 3.3 10*3/uL (ref 1.4–7.7)
Neutrophils Relative %: 57.4 % (ref 43.0–77.0)
Platelets: 186 10*3/uL (ref 150.0–400.0)
RBC: 4.25 Mil/uL (ref 4.22–5.81)
RDW: 13.5 % (ref 11.5–15.5)
WBC: 5.8 10*3/uL (ref 4.0–10.5)

## 2014-02-25 LAB — HEPATIC FUNCTION PANEL
ALBUMIN: 4.1 g/dL (ref 3.5–5.2)
ALK PHOS: 85 U/L (ref 39–117)
ALT: 64 U/L — ABNORMAL HIGH (ref 0–53)
AST: 54 U/L — AB (ref 0–37)
Bilirubin, Direct: 0.1 mg/dL (ref 0.0–0.3)
Total Bilirubin: 0.4 mg/dL (ref 0.2–1.2)
Total Protein: 7.6 g/dL (ref 6.0–8.3)

## 2014-02-25 MED ORDER — AZATHIOPRINE 50 MG PO TABS: 50.0000 mg | ORAL_TABLET | Freq: Every day | ORAL | Status: DC

## 2014-02-25 NOTE — Assessment & Plan Note (Signed)
Recheck labs today. 

## 2014-02-25 NOTE — Assessment & Plan Note (Signed)
Father passed away and he missed lab appointment and did not get refills Will restart AZA and check labs today RTC 6 months most likely

## 2014-02-25 NOTE — Patient Instructions (Addendum)
We have sent the following medications to your pharmacy for you to pick up at your convenience: Azathioprine  Please go to the lab and have blood drawn for a CBC/diff, LFT's.   I appreciate the opportunity to care for you. Stan Headarl Gessner, M.D., [facility]

## 2014-02-25 NOTE — Progress Notes (Signed)
Quick Note:  Liver tests are mildly elevated - he has been off Azathioprine  Please send him a letter 1) Liver chemistries are mildly abnormal again probably from lack of treatment 2) Taking azathioprine should fix this - we restarted today 3) Ask him to return in 3 months for LFT and CBC - specify a date for him to come ______

## 2014-02-26 ENCOUNTER — Other Ambulatory Visit: Payer: Self-pay

## 2014-02-26 DIAGNOSIS — Z79899 Other long term (current) drug therapy: Secondary | ICD-10-CM

## 2014-02-26 DIAGNOSIS — Z7960 Long term (current) use of unspecified immunomodulators and immunosuppressants: Secondary | ICD-10-CM

## 2014-02-27 NOTE — Progress Notes (Signed)
   Subjective:    Patient ID: Michael West, male    DOB: 06-Jul-1970, 44 y.o.   MRN: [id number]  HPI  Here with interpreter for f/u of aoutiimmune hepatitis. Father pased away in fall so he has not f/u and is not on azathioprine. Feels ok.  Medications, allergies, past medical history, past surgical history, family history and social history are reviewed and updated in the EMR.  Review of Systems As above    Objective:   Physical Exam  General:  NAD Eyes:   anicteric Lungs:  clear Heart:  S1S2 no rubs, murmurs or gallops Abdomen:  soft and nontender, BS+ Ext:   no edema    Data Reviewed:  prior labs, GI notes    Assessment & Plan:  Autoimmune hepatitis Father passed away and he missed lab appointment and did not get refills Will restart AZA and check labs today RTC 6 months most likely   Long-term use of immunosuppressant medication - azathioprine Recheck labs today

## 2014-06-02 ENCOUNTER — Other Ambulatory Visit (INDEPENDENT_AMBULATORY_CARE_PROVIDER_SITE_OTHER): Payer: 59

## 2014-06-02 DIAGNOSIS — Z7960 Long term (current) use of unspecified immunomodulators and immunosuppressants: Secondary | ICD-10-CM

## 2014-06-02 DIAGNOSIS — Z79899 Other long term (current) drug therapy: Secondary | ICD-10-CM

## 2014-06-02 LAB — CBC WITH DIFFERENTIAL/PLATELET
BASOS ABS: 0 10*3/uL (ref 0.0–0.1)
Basophils Relative: 0.2 % (ref 0.0–3.0)
Eosinophils Absolute: 0.1 10*3/uL (ref 0.0–0.7)
Eosinophils Relative: 1.9 % (ref 0.0–5.0)
HCT: 36.6 % — ABNORMAL LOW (ref 39.0–52.0)
HEMOGLOBIN: 12.5 g/dL — AB (ref 13.0–17.0)
LYMPHS PCT: 30.8 % (ref 12.0–46.0)
Lymphs Abs: 1.7 10*3/uL (ref 0.7–4.0)
MCHC: 34.1 g/dL (ref 30.0–36.0)
MCV: 89.9 fl (ref 78.0–100.0)
Monocytes Absolute: 0.4 10*3/uL (ref 0.1–1.0)
Monocytes Relative: 7 % (ref 3.0–12.0)
NEUTROS ABS: 3.3 10*3/uL (ref 1.4–7.7)
Neutrophils Relative %: 60.1 % (ref 43.0–77.0)
PLATELETS: 196 10*3/uL (ref 150.0–400.0)
RBC: 4.07 Mil/uL — ABNORMAL LOW (ref 4.22–5.81)
RDW: 14.5 % (ref 11.5–15.5)
WBC: 5.5 10*3/uL (ref 4.0–10.5)

## 2014-06-02 LAB — HEPATIC FUNCTION PANEL
ALT: 18 U/L (ref 0–53)
AST: 26 U/L (ref 0–37)
Albumin: 4.1 g/dL (ref 3.5–5.2)
Alkaline Phosphatase: 83 U/L (ref 39–117)
Bilirubin, Direct: 0.1 mg/dL (ref 0.0–0.3)
Total Bilirubin: 0.5 mg/dL (ref 0.2–1.2)
Total Protein: 7.8 g/dL (ref 6.0–8.3)

## 2014-06-05 ENCOUNTER — Encounter: Payer: Self-pay | Admitting: Internal Medicine

## 2014-06-05 NOTE — Progress Notes (Signed)
Quick Note:  Letter created re: results ok Will plan to test again after OV in July - asked to call and schedule ______

## 2014-06-18 IMAGING — US US BIOPSY
1 series · 7 of 7 positions shown · non-contrast
Comparison: none

CLINICAL DATA: Autoimmune hepatitis

[Series 1: us biopsy · 0.15mm/px · 7 of 7 slices shown]
[im 1/7]
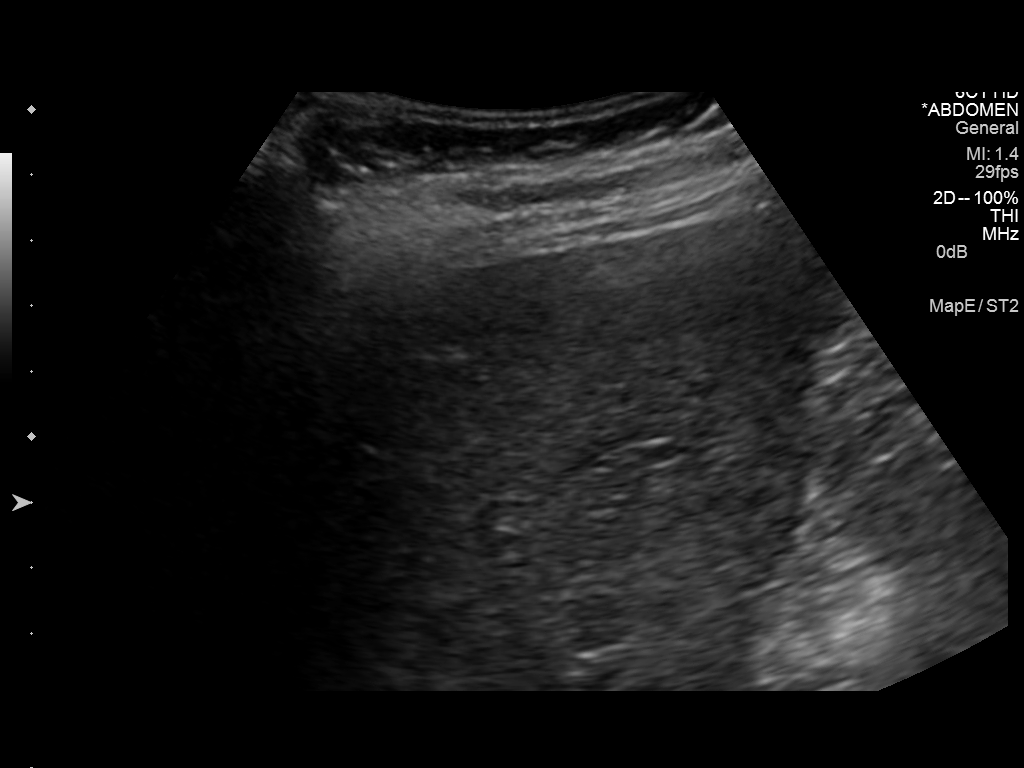
[im 2/7]
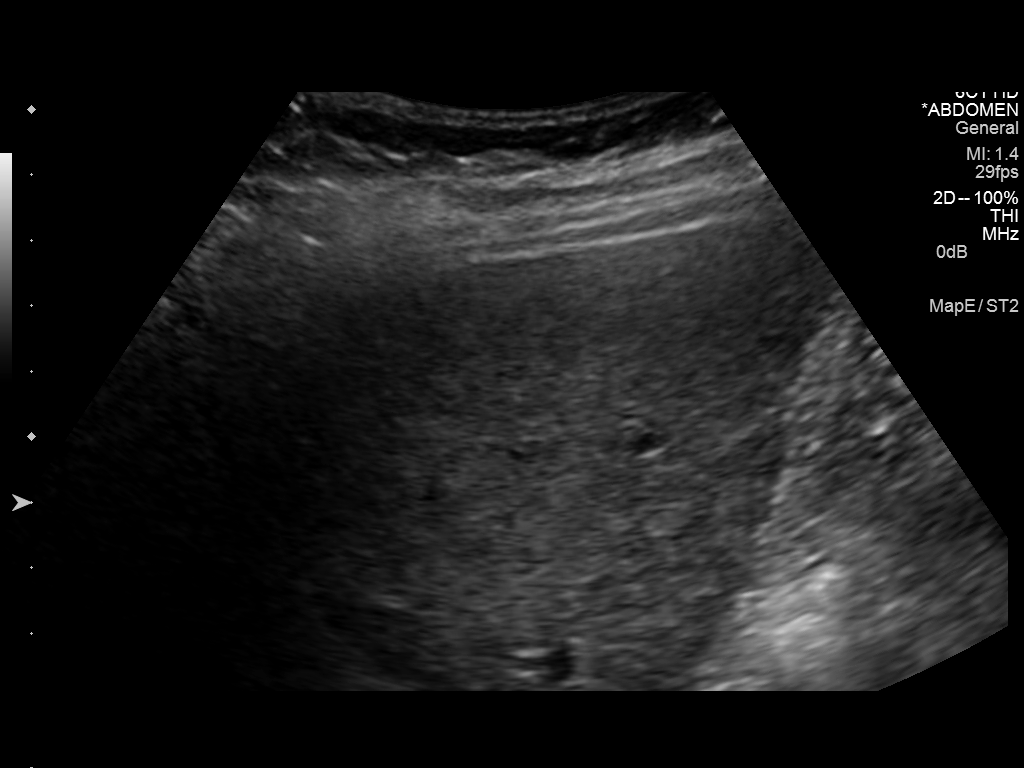
[im 3/7]
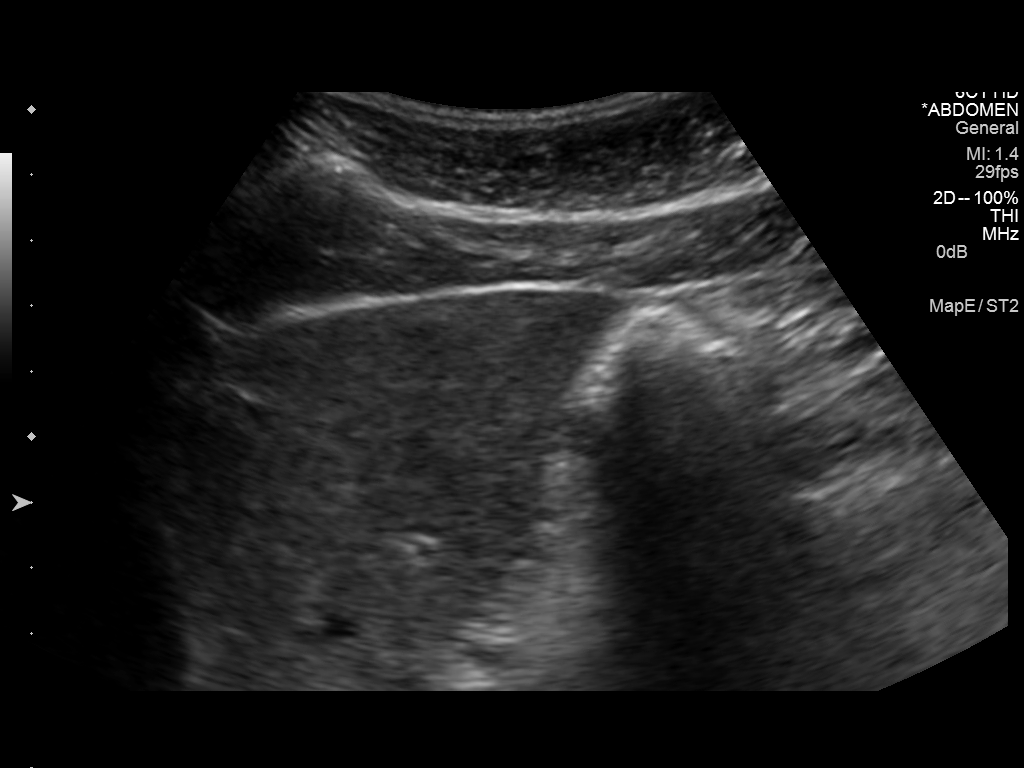
[im 4/7]
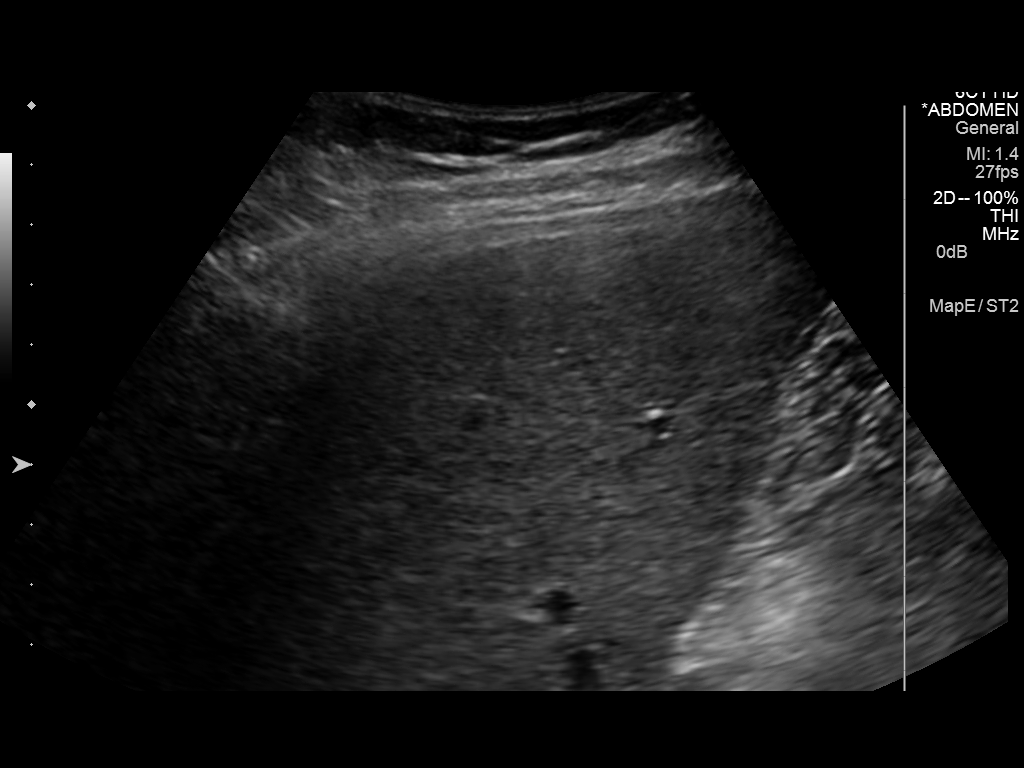
[im 5/7]
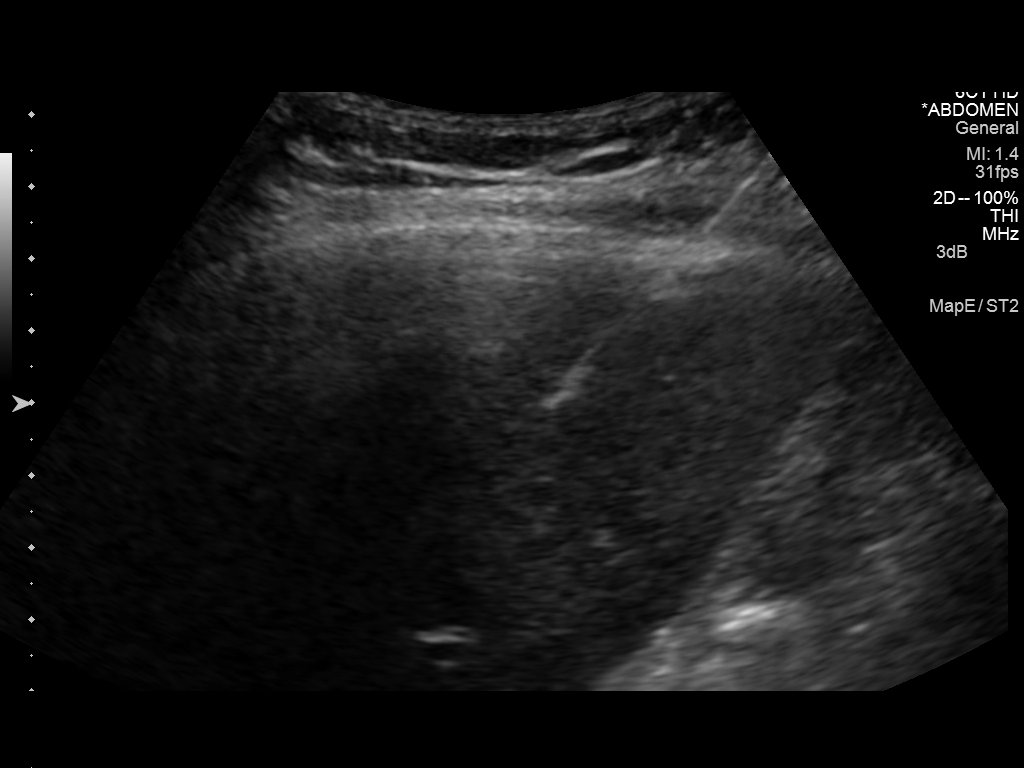
[im 6/7]
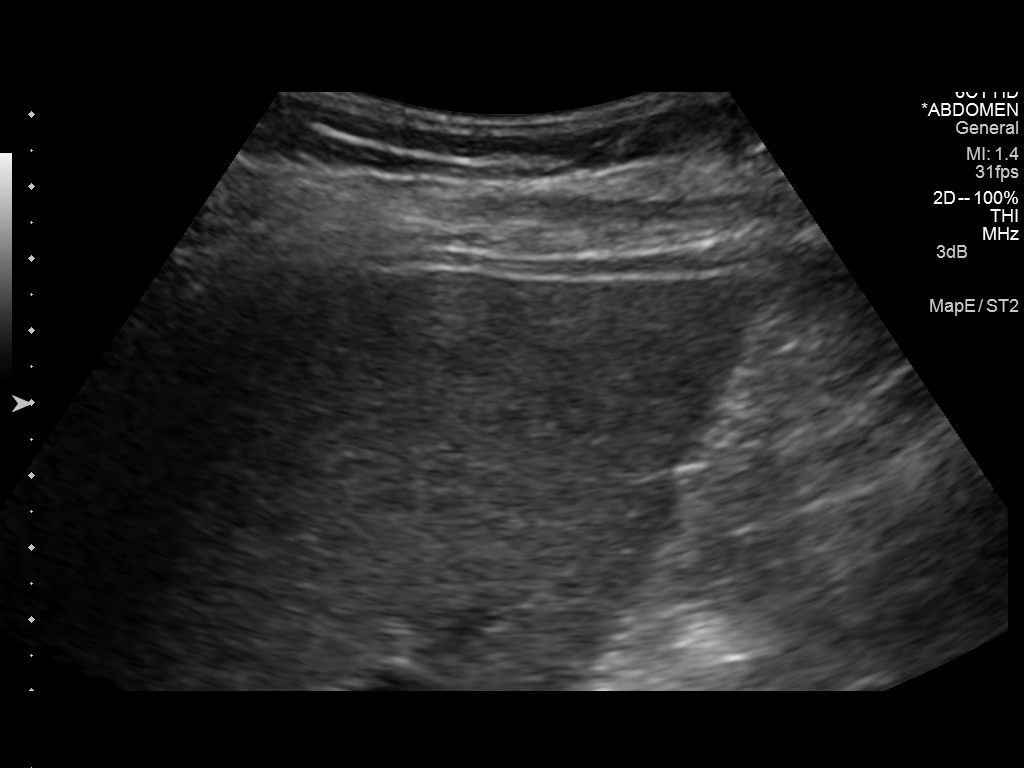
[im 7/7]
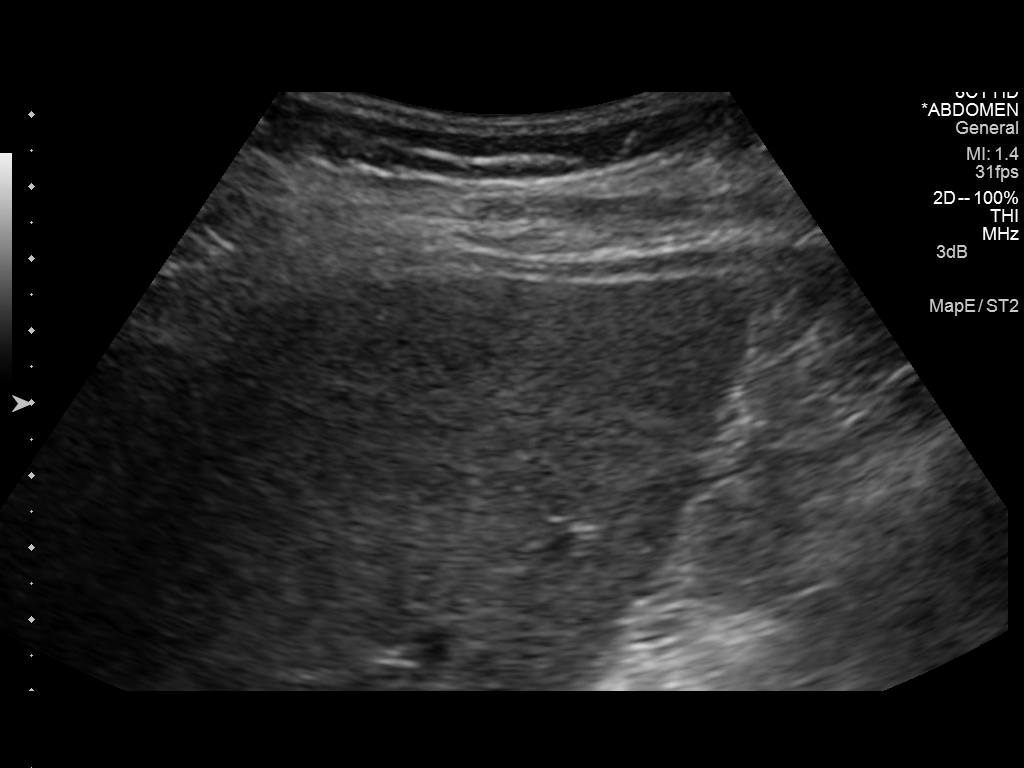

[7 of 7 positions shown; findings below may reference images not displayed]

EXAM:
ULTRASOUND-GUIDED RANDOM LIVER CORE BIOPSY.

MEDICATIONS AND MEDICAL HISTORY:
Versed 2 mg, Fentanyl 100 mcg.

Additional Medications: None.

ANESTHESIA/SEDATION:
Moderate sedation time: 5 minutes

PROCEDURE:
The procedure, risks, benefits, and alternatives were explained to
the patient. Questions regarding the procedure were encouraged and
answered. The patient understands and consents to the procedure.

The right flank was prepped with Betadine in a sterile fashion, and
a sterile drape was applied covering the operative field. A sterile
gown and sterile gloves were used for the procedure.

Under sonographic guidance, an 19gauge guide needle was advanced
into the right lobe of the liver. Subsequently four a 18 gauge core
biopsies were obtained. The guide needle was removed. Final imaging
was performed.

Patient tolerated the procedure well without complication. Vital
sign monitoring by nursing staff during the procedure will continue
as patient is in the special procedures unit for post procedure
observation.
FINDINGS: The images document guide needle placement within the right lobe of
the liver. Post biopsy images demonstrate no hemorrhage.
IMPRESSION: Successful ultrasound-guided core biopsy in the right lobe of the
liver.

## 2014-08-19 ENCOUNTER — Encounter: Payer: Self-pay | Admitting: Internal Medicine

## 2014-08-19 ENCOUNTER — Other Ambulatory Visit (INDEPENDENT_AMBULATORY_CARE_PROVIDER_SITE_OTHER): Payer: 59

## 2014-08-19 ENCOUNTER — Ambulatory Visit (INDEPENDENT_AMBULATORY_CARE_PROVIDER_SITE_OTHER): Payer: 59 | Admitting: Internal Medicine

## 2014-08-19 VITALS — BP 102/60 | HR 72 | Ht 67.75 in | Wt 192.2 lb

## 2014-08-19 DIAGNOSIS — K754 Autoimmune hepatitis: Secondary | ICD-10-CM | POA: Diagnosis not present

## 2014-08-19 DIAGNOSIS — Z7960 Long term (current) use of unspecified immunomodulators and immunosuppressants: Secondary | ICD-10-CM

## 2014-08-19 DIAGNOSIS — Z79899 Other long term (current) drug therapy: Secondary | ICD-10-CM | POA: Diagnosis not present

## 2014-08-19 LAB — COMPREHENSIVE METABOLIC PANEL
ALBUMIN: 4.3 g/dL (ref 3.5–5.2)
ALK PHOS: 81 U/L (ref 39–117)
ALT: 48 U/L (ref 0–53)
AST: 43 U/L — ABNORMAL HIGH (ref 0–37)
BILIRUBIN TOTAL: 0.5 mg/dL (ref 0.2–1.2)
BUN: 16 mg/dL (ref 6–23)
CO2: 28 meq/L (ref 19–32)
Calcium: 9.4 mg/dL (ref 8.4–10.5)
Chloride: 104 mEq/L (ref 96–112)
Creatinine, Ser: 0.72 mg/dL (ref 0.40–1.50)
GFR: 124.87 mL/min (ref >60.00)
GLUCOSE: 90 mg/dL (ref 70–99)
Potassium: 4 mEq/L (ref 3.5–5.1)
SODIUM: 138 meq/L (ref 135–145)
Total Protein: 8.1 g/dL (ref 6.0–8.3)

## 2014-08-19 LAB — CBC WITH DIFFERENTIAL/PLATELET
Basophils Absolute: 0 10*3/uL (ref 0.0–0.1)
Basophils Relative: 0.3 % (ref 0.0–3.0)
Eosinophils Absolute: 0.1 10*3/uL (ref 0.0–0.7)
Eosinophils Relative: 1.3 % (ref 0.0–5.0)
HEMATOCRIT: 37.5 % — AB (ref 39.0–52.0)
Hemoglobin: 12.8 g/dL — ABNORMAL LOW (ref 13.0–17.0)
LYMPHS ABS: 2 10*3/uL (ref 0.7–4.0)
Lymphocytes Relative: 24.4 % (ref 12.0–46.0)
MCHC: 34.2 g/dL (ref 30.0–36.0)
MCV: 89.3 fl (ref 78.0–100.0)
MONO ABS: 0.6 10*3/uL (ref 0.1–1.0)
MONOS PCT: 6.9 % (ref 3.0–12.0)
Neutro Abs: 5.6 10*3/uL (ref 1.4–7.7)
Neutrophils Relative %: 67.1 % (ref 43.0–77.0)
PLATELETS: 178 10*3/uL (ref 150.0–400.0)
RBC: 4.2 Mil/uL — ABNORMAL LOW (ref 4.22–5.81)
RDW: 13.2 % (ref 11.5–15.5)
WBC: 8.3 10*3/uL (ref 4.0–10.5)

## 2014-08-19 NOTE — Patient Instructions (Addendum)
   Your physician has requested that you go to the basement for the following lab work before leaving today: CBC/diff, CMET  We will mail you results.   Follow up with Dr Leone PayorGessner in 6 months, call in October for a January appointment.   I appreciate the opportunity to care for you. Stan Headarl Taishaun Levels, MD, [facility]

## 2014-08-19 NOTE — Assessment & Plan Note (Signed)
No signs of toxicity 

## 2014-08-19 NOTE — Progress Notes (Signed)
   Subjective:    Patient ID: Michael West, male    DOB: [birth year], 44 y.o.   MRN: [id number] Cc: autoimmune hepatitis  HPI The patient is here and the language line interpretation is used. He feels well without any symptoms from his autoimmune hepatitis. He reports he is taking his medicine. He has been compliant with laboratory testing today.  Medications, allergies, past medical history, past surgical history, family history and social history are reviewed and updated in the EMR.  Review of Systems All other review of systems negative today    Objective:   Physical Exam @BP  102/60 mmHg  Pulse 72  Ht 5' 7.75" (1.721 m)  Wt 192 lb 4 oz (87.204 kg)  BMI 29.44 kg/m2@  General:  NAD Eyes:   anicteric Lungs:  clear Heart:: S1S2 no rubs, murmurs or gallops Abdomen:  soft and nontender, BS+ Ext:   no edema, cyanosis or clubbing    Data Reviewed:  CBC LFT in EMR    Assessment & Plan:  Autoimmune hepatitis Continue current therapy RTC 6 months  Long-term use of immunosuppressant medication - azathioprine No signs of toxicity  15 minutes time spent with patient > half in counseling coordination of care

## 2014-08-19 NOTE — Progress Notes (Signed)
Quick Note:  Labs are ok Needs repeat in 3 months Enter orders for CBC ad LFT dx long-term use immunosuppressants I am crafting a letter to him ______

## 2014-08-19 NOTE — Assessment & Plan Note (Addendum)
Continue current therapy RTC 6 months

## 2014-08-20 ENCOUNTER — Other Ambulatory Visit: Payer: Self-pay

## 2014-08-20 DIAGNOSIS — Z79899 Other long term (current) drug therapy: Secondary | ICD-10-CM

## 2014-08-20 DIAGNOSIS — Z7960 Long term (current) use of unspecified immunomodulators and immunosuppressants: Secondary | ICD-10-CM

## 2014-11-26 ENCOUNTER — Other Ambulatory Visit (INDEPENDENT_AMBULATORY_CARE_PROVIDER_SITE_OTHER): Payer: 59

## 2014-11-26 ENCOUNTER — Encounter: Payer: Self-pay | Admitting: Internal Medicine

## 2014-11-26 DIAGNOSIS — Z7960 Long term (current) use of unspecified immunomodulators and immunosuppressants: Secondary | ICD-10-CM

## 2014-11-26 DIAGNOSIS — Z79899 Other long term (current) drug therapy: Secondary | ICD-10-CM | POA: Diagnosis not present

## 2014-11-26 LAB — HEPATIC FUNCTION PANEL
ALK PHOS: 65 U/L (ref 39–117)
ALT: 13 U/L (ref 0–53)
AST: 21 U/L (ref 0–37)
Albumin: 4.5 g/dL (ref 3.5–5.2)
BILIRUBIN DIRECT: 0.1 mg/dL (ref 0.0–0.3)
BILIRUBIN TOTAL: 0.5 mg/dL (ref 0.2–1.2)
Total Protein: 8.4 g/dL — ABNORMAL HIGH (ref 6.0–8.3)

## 2014-11-26 LAB — CBC WITH DIFFERENTIAL/PLATELET
Basophils Absolute: 0 10*3/uL (ref 0.0–0.1)
Basophils Relative: 0.2 % (ref 0.0–3.0)
EOS ABS: 0.1 10*3/uL (ref 0.0–0.7)
Eosinophils Relative: 1 % (ref 0.0–5.0)
HCT: 39.3 % (ref 39.0–52.0)
HEMOGLOBIN: 13.2 g/dL (ref 13.0–17.0)
Lymphocytes Relative: 27.3 % (ref 12.0–46.0)
Lymphs Abs: 2.1 10*3/uL (ref 0.7–4.0)
MCHC: 33.6 g/dL (ref 30.0–36.0)
MCV: 90.6 fl (ref 78.0–100.0)
Monocytes Absolute: 0.4 10*3/uL (ref 0.1–1.0)
Monocytes Relative: 5.6 % (ref 3.0–12.0)
Neutro Abs: 5.2 10*3/uL (ref 1.4–7.7)
Neutrophils Relative %: 65.9 % (ref 43.0–77.0)
Platelets: 221 10*3/uL (ref 150.0–400.0)
RBC: 4.34 Mil/uL (ref 4.22–5.81)
RDW: 14.2 % (ref 11.5–15.5)
WBC: 7.8 10*3/uL (ref 4.0–10.5)

## 2015-01-28 ENCOUNTER — Ambulatory Visit (INDEPENDENT_AMBULATORY_CARE_PROVIDER_SITE_OTHER): Payer: 59 | Admitting: Internal Medicine

## 2015-01-28 ENCOUNTER — Other Ambulatory Visit (INDEPENDENT_AMBULATORY_CARE_PROVIDER_SITE_OTHER): Payer: 59

## 2015-01-28 ENCOUNTER — Encounter: Payer: Self-pay | Admitting: Internal Medicine

## 2015-01-28 VITALS — BP 104/70 | HR 64 | Ht 68.0 in | Wt 192.0 lb

## 2015-01-28 DIAGNOSIS — M25532 Pain in left wrist: Secondary | ICD-10-CM | POA: Diagnosis not present

## 2015-01-28 DIAGNOSIS — Z7960 Long term (current) use of unspecified immunomodulators and immunosuppressants: Secondary | ICD-10-CM

## 2015-01-28 DIAGNOSIS — Z79899 Other long term (current) drug therapy: Secondary | ICD-10-CM

## 2015-01-28 DIAGNOSIS — K754 Autoimmune hepatitis: Secondary | ICD-10-CM

## 2015-01-28 LAB — CBC WITH DIFFERENTIAL/PLATELET
BASOS ABS: 0 10*3/uL (ref 0.0–0.1)
Basophils Relative: 0.3 % (ref 0.0–3.0)
EOS ABS: 0.1 10*3/uL (ref 0.0–0.7)
Eosinophils Relative: 2.2 % (ref 0.0–5.0)
HCT: 40.2 % (ref 39.0–52.0)
HEMOGLOBIN: 13.4 g/dL (ref 13.0–17.0)
Lymphocytes Relative: 33.3 % (ref 12.0–46.0)
Lymphs Abs: 1.9 10*3/uL (ref 0.7–4.0)
MCHC: 33.2 g/dL (ref 30.0–36.0)
MCV: 89.3 fl (ref 78.0–100.0)
MONO ABS: 0.4 10*3/uL (ref 0.1–1.0)
Monocytes Relative: 6.8 % (ref 3.0–12.0)
Neutro Abs: 3.2 10*3/uL (ref 1.4–7.7)
Neutrophils Relative %: 57.4 % (ref 43.0–77.0)
Platelets: 199 10*3/uL (ref 150.0–400.0)
RBC: 4.51 Mil/uL (ref 4.22–5.81)
RDW: 13.1 % (ref 11.5–15.5)
WBC: 5.7 10*3/uL (ref 4.0–10.5)

## 2015-01-28 LAB — COMPREHENSIVE METABOLIC PANEL
ALBUMIN: 4.3 g/dL (ref 3.5–5.2)
ALT: 14 U/L (ref 0–53)
AST: 22 U/L (ref 0–37)
Alkaline Phosphatase: 73 U/L (ref 39–117)
BUN: 10 mg/dL (ref 6–23)
CHLORIDE: 104 meq/L (ref 96–112)
CO2: 26 mEq/L (ref 19–32)
CREATININE: 0.74 mg/dL (ref 0.40–1.50)
Calcium: 9.6 mg/dL (ref 8.4–10.5)
GFR: 120.75 mL/min (ref >60.00)
Glucose, Bld: 103 mg/dL — ABNORMAL HIGH (ref 70–99)
Potassium: 4.1 mEq/L (ref 3.5–5.1)
SODIUM: 140 meq/L (ref 135–145)
TOTAL PROTEIN: 8.1 g/dL (ref 6.0–8.3)
Total Bilirubin: 0.4 mg/dL (ref 0.2–1.2)

## 2015-01-28 NOTE — Progress Notes (Signed)
   Subjective:    Patient ID: Michael West, male    DOB: 05/12/1970, 44 y.o.   MRN: [id number] Cc" f/u autoimmune hepatitis HPI  Patient is here with an interpreter. He has no complaints with respect to the GI system. He has been compliant with his medications as best I can tell. He is planning a trip back to [place] in January and/or February. Medications, allergies, past medical history, past surgical history, family history and social history are reviewed and updated in the EMR.   Review of Systems Having left wrist pain, ? Some numbness in 2 and 3rd digits    Objective:   Physical Exam @BP  104/70 mmHg  Pulse 64  Ht 5\' 8"  (1.727 m)  Wt 192 lb (87.091 kg)  BMI 29.20 kg/m2@  General:  NAD Eyes:   anicteric Lungs:  clear Heart:: S1S2 no rubs, murmurs or gallops Abdomen:  soft and nontender, BS+, no HSM/mass Ext:   Left wrist nontender, FROM Skin:  Punctate flat/raised scars R post lumbar region - had deliberately done - ? Cultural     Data Reviewed:  LFT, CBC Oct 2016 NL     Assessment & Plan:  Autoimmune hepatitis Medically well. LFTs normal. Remains in chemical and clinical remission. Could possibly consider withdrawal of treatment summer 2017, which would be two years after remission. This would require normal IgG levels transaminases and bilirubin. Return clinic visit 6 months sooner as needed  Long-term use of immunosuppressant medication - azathioprine No signs of toxicity. Continue periodic CBC and LFTs. Will be due again in February.    Left wrist pain   follow up PCP

## 2015-01-28 NOTE — Assessment & Plan Note (Signed)
No signs of toxicity. Continue periodic CBC and LFTs. Will be due again in February.

## 2015-01-28 NOTE — Assessment & Plan Note (Signed)
Medically well. LFTs normal. Remains in chemical and clinical remission. Could possibly consider withdrawal of treatment summer 2017, which would be two years after remission. This would require normal IgG levels transaminases and bilirubin. Return clinic visit 6 months sooner as needed

## 2015-01-28 NOTE — Patient Instructions (Signed)
  Please come and have labs drawn February 20th 2017.   The lab is open 7:30AM-5:30PM, close for lunch 1:30PM to 2:00PM   Go to Dr Patsy Lageropland to get care for your Left wrist pain.   I appreciate the opportunity to care for you. Stan Headarl Gessner, MD, [facility]

## 2015-01-29 ENCOUNTER — Encounter: Payer: Self-pay | Admitting: Internal Medicine

## 2015-02-27 ENCOUNTER — Encounter: Payer: Self-pay | Admitting: Family Medicine

## 2015-03-04 ENCOUNTER — Encounter: Payer: Self-pay | Admitting: Family Medicine

## 2015-04-02 ENCOUNTER — Other Ambulatory Visit (INDEPENDENT_AMBULATORY_CARE_PROVIDER_SITE_OTHER): Payer: 59

## 2015-04-02 ENCOUNTER — Other Ambulatory Visit: Payer: Self-pay | Admitting: Internal Medicine

## 2015-04-02 DIAGNOSIS — Z79899 Other long term (current) drug therapy: Secondary | ICD-10-CM | POA: Diagnosis not present

## 2015-04-02 DIAGNOSIS — K754 Autoimmune hepatitis: Secondary | ICD-10-CM | POA: Diagnosis not present

## 2015-04-02 LAB — CBC WITH DIFFERENTIAL/PLATELET
BASOS PCT: 0.6 % (ref 0.0–3.0)
Basophils Absolute: 0 10*3/uL (ref 0.0–0.1)
EOS ABS: 1.1 10*3/uL — AB (ref 0.0–0.7)
HEMATOCRIT: 36 % — AB (ref 39.0–52.0)
HEMOGLOBIN: 12.1 g/dL — AB (ref 13.0–17.0)
LYMPHS PCT: 28.4 % (ref 12.0–46.0)
Lymphs Abs: 2.1 10*3/uL (ref 0.7–4.0)
MCHC: 33.6 g/dL (ref 30.0–36.0)
MCV: 88.1 fl (ref 78.0–100.0)
MONOS PCT: 7.6 % (ref 3.0–12.0)
Monocytes Absolute: 0.6 10*3/uL (ref 0.1–1.0)
NEUTROS ABS: 3.5 10*3/uL (ref 1.4–7.7)
Neutrophils Relative %: 48 % (ref 43.0–77.0)
Platelets: 216 10*3/uL (ref 150.0–400.0)
RBC: 4.09 Mil/uL — ABNORMAL LOW (ref 4.22–5.81)
RDW: 13.9 % (ref 11.5–15.5)
WBC: 7.4 10*3/uL (ref 4.0–10.5)

## 2015-04-02 LAB — COMPREHENSIVE METABOLIC PANEL
ALBUMIN: 4.3 g/dL (ref 3.5–5.2)
ALT: 20 U/L (ref 0–53)
AST: 26 U/L (ref 0–37)
Alkaline Phosphatase: 73 U/L (ref 39–117)
BILIRUBIN TOTAL: 0.4 mg/dL (ref 0.2–1.2)
BUN: 17 mg/dL (ref 6–23)
CALCIUM: 9.2 mg/dL (ref 8.4–10.5)
CHLORIDE: 103 meq/L (ref 96–112)
CO2: 30 mEq/L (ref 19–32)
CREATININE: 0.8 mg/dL (ref 0.40–1.50)
GFR: 110.28 mL/min (ref >60.00)
Glucose, Bld: 112 mg/dL — ABNORMAL HIGH (ref 70–99)
Potassium: 4.5 mEq/L (ref 3.5–5.1)
Sodium: 137 mEq/L (ref 135–145)
Total Protein: 7.9 g/dL (ref 6.0–8.3)

## 2015-04-03 NOTE — Progress Notes (Signed)
Quick Note:  Please send a letter with results stating they are ok Needs REV 3 months and same labs then ______

## 2015-04-06 ENCOUNTER — Other Ambulatory Visit: Payer: Self-pay

## 2015-04-06 DIAGNOSIS — K754 Autoimmune hepatitis: Secondary | ICD-10-CM

## 2015-08-10 ENCOUNTER — Other Ambulatory Visit (INDEPENDENT_AMBULATORY_CARE_PROVIDER_SITE_OTHER): Payer: Managed Care, Other (non HMO)

## 2015-08-10 ENCOUNTER — Encounter: Payer: Self-pay | Admitting: Internal Medicine

## 2015-08-10 ENCOUNTER — Ambulatory Visit (INDEPENDENT_AMBULATORY_CARE_PROVIDER_SITE_OTHER): Payer: Managed Care, Other (non HMO) | Admitting: Internal Medicine

## 2015-08-10 VITALS — BP 110/68 | HR 60 | Ht 68.0 in | Wt 196.0 lb

## 2015-08-10 DIAGNOSIS — R1012 Left upper quadrant pain: Secondary | ICD-10-CM

## 2015-08-10 DIAGNOSIS — K754 Autoimmune hepatitis: Secondary | ICD-10-CM

## 2015-08-10 DIAGNOSIS — R1011 Right upper quadrant pain: Secondary | ICD-10-CM | POA: Diagnosis not present

## 2015-08-10 DIAGNOSIS — Z23 Encounter for immunization: Secondary | ICD-10-CM

## 2015-08-10 DIAGNOSIS — Z79899 Other long term (current) drug therapy: Secondary | ICD-10-CM | POA: Diagnosis not present

## 2015-08-10 DIAGNOSIS — E559 Vitamin D deficiency, unspecified: Secondary | ICD-10-CM

## 2015-08-10 DIAGNOSIS — M546 Pain in thoracic spine: Secondary | ICD-10-CM | POA: Diagnosis not present

## 2015-08-10 DIAGNOSIS — G8929 Other chronic pain: Secondary | ICD-10-CM

## 2015-08-10 DIAGNOSIS — Z7960 Long term (current) use of unspecified immunomodulators and immunosuppressants: Secondary | ICD-10-CM

## 2015-08-10 DIAGNOSIS — M25572 Pain in left ankle and joints of left foot: Secondary | ICD-10-CM

## 2015-08-10 LAB — HEPATIC FUNCTION PANEL
ALK PHOS: 82 U/L (ref 39–117)
ALT: 14 U/L (ref 0–53)
AST: 21 U/L (ref 0–37)
Albumin: 4.5 g/dL (ref 3.5–5.2)
BILIRUBIN DIRECT: 0.1 mg/dL (ref 0.0–0.3)
BILIRUBIN TOTAL: 0.6 mg/dL (ref 0.2–1.2)
Total Protein: 8.6 g/dL — ABNORMAL HIGH (ref 6.0–8.3)

## 2015-08-10 LAB — COMPREHENSIVE METABOLIC PANEL
ALBUMIN: 4.5 g/dL (ref 3.5–5.2)
ALK PHOS: 82 U/L (ref 39–117)
ALT: 14 U/L (ref 0–53)
AST: 21 U/L (ref 0–37)
BILIRUBIN TOTAL: 0.6 mg/dL (ref 0.2–1.2)
BUN: 9 mg/dL (ref 6–23)
CALCIUM: 9.7 mg/dL (ref 8.4–10.5)
CO2: 27 mEq/L (ref 19–32)
CREATININE: 0.7 mg/dL (ref 0.40–1.50)
Chloride: 103 mEq/L (ref 96–112)
GFR: 128.45 mL/min (ref >60.00)
Glucose, Bld: 78 mg/dL (ref 70–99)
Potassium: 3.8 mEq/L (ref 3.5–5.1)
Sodium: 136 mEq/L (ref 135–145)
TOTAL PROTEIN: 8.6 g/dL — AB (ref 6.0–8.3)

## 2015-08-10 LAB — CBC WITH DIFFERENTIAL/PLATELET
BASOS ABS: 0 10*3/uL (ref 0.0–0.1)
BASOS PCT: 0.5 % (ref 0.0–3.0)
EOS ABS: 1.8 10*3/uL — AB (ref 0.0–0.7)
Eosinophils Relative: 20.1 % — ABNORMAL HIGH (ref 0.0–5.0)
HCT: 38.9 % — ABNORMAL LOW (ref 39.0–52.0)
Hemoglobin: 12.9 g/dL — ABNORMAL LOW (ref 13.0–17.0)
Lymphocytes Relative: 32 % (ref 12.0–46.0)
Lymphs Abs: 2.9 10*3/uL (ref 0.7–4.0)
MCHC: 33.2 g/dL (ref 30.0–36.0)
MCV: 87.2 fl (ref 78.0–100.0)
MONO ABS: 0.6 10*3/uL (ref 0.1–1.0)
Monocytes Relative: 6.2 % (ref 3.0–12.0)
NEUTROS ABS: 3.7 10*3/uL (ref 1.4–7.7)
Neutrophils Relative %: 41.2 % — ABNORMAL LOW (ref 43.0–77.0)
PLATELETS: 222 10*3/uL (ref 150.0–400.0)
RBC: 4.46 Mil/uL (ref 4.22–5.81)
RDW: 13.7 % (ref 11.5–15.5)
WBC: 9 10*3/uL (ref 4.0–10.5)

## 2015-08-10 LAB — VITAMIN D 25 HYDROXY (VIT D DEFICIENCY, FRACTURES): VITD: 17.28 ng/mL — AB (ref 30.00–100.00)

## 2015-08-10 NOTE — Assessment & Plan Note (Signed)
prevnar 

## 2015-08-10 NOTE — Assessment & Plan Note (Signed)
Recheck labs 

## 2015-08-10 NOTE — Patient Instructions (Addendum)
  Your physician has requested that you go to the basement for the following lab work before leaving today: CBC/diff, CMET, VIT D level   Today you have been given a Prevnar 13 and a handout about this vaccine.  We will contact you (per pt mail him appt info) about an appointment with Dr Stacey DrainWilliam Truslow regarding your joint pain.  Their phone # is :[phone]. They are located at : [address]  [place] [id number].   I appreciate the opportunity to care for you.

## 2015-08-10 NOTE — Progress Notes (Signed)
   Subjective:    Patient ID: Michael West, male    DOB: 05/19/1970, 45 y.o.   MRN: [id number] Cc: f/u autoimmune hepatitis also has upper abdominal/rib pains HPI Here w/ interpreter - having bilateral upper abdominal quadrant pains - seem worse with movement. Bends and lifts a lot at work. No discrete injury. Also still has L ankle pains. Hx pains off and on. Also mid back pain No other c/o Medications, allergies, past medical history, past surgical history, family history and social history are reviewed and updated in the EMR.  No Known Allergies Outpatient Prescriptions Prior to Visit  Medication Sig Dispense Refill  . azaTHIOprine (IMURAN) 50 MG tablet Take 1 tablet (50 mg total) by mouth daily. 30 tablet 5  . Cholecalciferol (VITAMIN D3) 1000 UNITS CAPS Take 1 capsule (1,000 Units total) by mouth daily. 90 capsule 3   No facility-administered medications prior to visit.   Past Medical History  Diagnosis Date  . Autoimmune hepatitis (HCC)   . Unspecified vitamin D deficiency 03/30/2012    .03/2012 - Level is 14   Past Surgical History  Procedure Laterality Date  . Proctoscopy  12/06/2000    Internal and external hemorrhoidectomy  . Hemorrhoid surgery  12/06/2000  . Dental prosthesis    . Percutaneous liver biopsy     Social History   Social History  . Marital Status: Married    Spouse Name: Tristan SchroederLien  . Number of Children: 0  . Years of Education: 3   Occupational History  .  [employer]   Social History Main Topics  . Smoking status: Never Smoker   . Smokeless tobacco: Never Used  . Alcohol Use: No  . Drug Use: No  . Sexual Activity: Yes   Other Topics Concern  . None   Social History Narrative   Moved to the [place] from [place] in 11/1996.   Married   Family History  Problem Relation Age of Onset  . Colon cancer Mother     Review of Systems As above    Objective:   Physical Exam @BP  110/68 mmHg  Pulse 60  Ht 5\' 8"  (1.727 m)  Wt 196 lb (88.905 kg)   BMI 29.81 kg/m2@  General:  Well-developed, well-nourished and in no acute distress Eyes:  anicteric. Lungs: Clear to auscultation bilaterally. Heart:  S1S2, no rubs, murmurs, gallops. Abdomen:  soft, non-tender, no hepatosplenomegaly, hernia, or mass and BS+.  But tender lower costal margins and upper abdominal wall + carnett's Extremities:    sl puffy edema, joints grossly w/o inflammatory changes Skin   no rash. Neuro:  A&O x 3.  Psych:  appropriate mood and  Affect.   Data Reviewed:   Prior labs LFT/CBC earlier 2017     Assessment & Plan:   Encounter Diagnoses  Name Primary?  . Autoimmune hepatitis (HCC) Yes  . Long-term use of immunosuppressant medication - azathioprine   . Abdominal wall pain in both upper quadrants   . Thoracic spine pain   . Chronic pain of left ankle   . Vitamin D deficiency     Lab recheck today - CBC, CMET, vit D Refer to Dr. Kellie Simmeringruslow - evaluate joint pains in setting of AIH - suspect arthralgia but ask for rgeumatology expertise See me 4-6 months Review FHx Colon cancer then  Cc: Vicki MalletWill Truslow, MD

## 2015-08-10 NOTE — Assessment & Plan Note (Signed)
Recheck.

## 2015-08-12 ENCOUNTER — Other Ambulatory Visit: Payer: Self-pay

## 2015-08-12 DIAGNOSIS — K754 Autoimmune hepatitis: Secondary | ICD-10-CM

## 2015-08-12 MED ORDER — VITAMIN D (ERGOCALCIFEROL) 1.25 MG (50000 UNIT) PO CAPS: 50000.0000 [IU] | ORAL_CAPSULE | ORAL | Status: AC

## 2015-08-12 NOTE — Progress Notes (Signed)
Quick Note:  Send a letter that vitamin D level low again  1) Rx 50KU vit D weekly x 12 2) Repeat labs as above in 3 months 3) also do IgG in 3 months ______

## 2015-08-14 ENCOUNTER — Telehealth: Payer: Self-pay

## 2015-08-14 DIAGNOSIS — K754 Autoimmune hepatitis: Secondary | ICD-10-CM

## 2015-08-14 NOTE — Telephone Encounter (Signed)
Dr Ines Bloomerruslow's office is unable to see non-English speaking patients.  I will forward to Dr Leone PayorGessner to advise.

## 2015-09-01 NOTE — Telephone Encounter (Signed)
Touching base to see if you have found any one to see Michael West, thank you.

## 2015-09-03 NOTE — Telephone Encounter (Signed)
Left message at Dr Percell Locus office for them to call me back.

## 2015-09-03 NOTE — Telephone Encounter (Signed)
Please refer to Dr. Jeananne Rama

## 2015-09-08 NOTE — Telephone Encounter (Signed)
Spoke with Dr Fatima Sanger office and they are not on Epic until October so I will fax over a paper referral for them to set up an appointment.

## 2015-09-09 NOTE — Telephone Encounter (Signed)
Unable to get the referral fax to go thru after multiple times yesterday and today so mailing them the referral information.  I called them yesterday to confirm fax #'s and they said several people had called saying the faxes would not go thru.  I mailed the information to Att: Amy Littreal who does the referrals for Dr Corliss Skains. I put a note in with the records that he prefers that the appointment information be mailed to him so he can get someone to read it to him.

## 2015-09-15 NOTE — Telephone Encounter (Signed)
I called Dr. Fatima Sangereveshwar's office and they are checking on this referral and will call me back.

## 2015-09-18 NOTE — Telephone Encounter (Signed)
Spoke with Kathie RhodesBetty at Dr Fatima Sangereveshwar's office and they are working on getting him an appointment.

## 2015-09-25 NOTE — Telephone Encounter (Signed)
Spoke with Kathie RhodesBetty again at Dr Fatima Sangereveshwar's office, she said she has called and left message for Mr Laveda Normanran to call her back.  She will try and reach him again today.

## 2015-10-02 NOTE — Telephone Encounter (Signed)
Kathie RhodesBetty in a meeting today so I will call back next week to check on appointment status.

## 2015-10-06 NOTE — Telephone Encounter (Signed)
Spoke with Michael West at Dr Fatima Sangereveshwar's office, they have made Michael Normanran an appointment for 11/24/2015 at 9:45AM, arrive at 9:30AM.  They have been unable to contact him by phone so they will mail him this information and I will mail him a note as well about the appointment.

## 2015-11-24 ENCOUNTER — Institutional Professional Consult (permissible substitution): Payer: Self-pay | Admitting: Rheumatology

## 2016-01-06 ENCOUNTER — Ambulatory Visit: Payer: Self-pay | Admitting: Rheumatology

## 2016-01-08 ENCOUNTER — Institutional Professional Consult (permissible substitution): Payer: Self-pay | Admitting: Rheumatology

## 2021-09-09 ENCOUNTER — Other Ambulatory Visit: Payer: Self-pay | Admitting: Physician Assistant

## 2021-09-09 ENCOUNTER — Ambulatory Visit
Admission: RE | Admit: 2021-09-09 | Discharge: 2021-09-09 | Disposition: A | Discharge status: Home / Self Care | Payer: 59 | Source: Ambulatory Visit | Attending: Physician Assistant | Admitting: Physician Assistant

## 2021-09-09 DIAGNOSIS — M79641 Pain in right hand: Secondary | ICD-10-CM

## 2021-09-09 DIAGNOSIS — M25511 Pain in right shoulder: Secondary | ICD-10-CM

## 2021-09-09 DIAGNOSIS — M25571 Pain in right ankle and joints of right foot: Secondary | ICD-10-CM

## 2021-09-21 ENCOUNTER — Ambulatory Visit (INDEPENDENT_AMBULATORY_CARE_PROVIDER_SITE_OTHER): Payer: 59 | Admitting: Orthopaedic Surgery

## 2021-09-21 DIAGNOSIS — M25572 Pain in left ankle and joints of left foot: Secondary | ICD-10-CM

## 2021-09-21 DIAGNOSIS — M25511 Pain in right shoulder: Secondary | ICD-10-CM

## 2021-09-21 DIAGNOSIS — G8929 Other chronic pain: Secondary | ICD-10-CM

## 2021-09-21 MED ORDER — BUPIVACAINE HCL 0.25 % IJ SOLN
2.0000 mL | INTRAMUSCULAR | Status: AC | PRN
  Administered 2021-09-21: 2 mL via INTRA_ARTICULAR

## 2021-09-21 MED ORDER — METHYLPREDNISOLONE ACETATE 40 MG/ML IJ SUSP
40.0000 mg | INTRAMUSCULAR | Status: AC | PRN
  Administered 2021-09-21: 40 mg via INTRA_ARTICULAR

## 2021-09-21 MED ORDER — LIDOCAINE HCL 2 % IJ SOLN
2.0000 mL | INTRAMUSCULAR | Status: AC | PRN
  Administered 2021-09-21: 2 mL

## 2021-09-21 NOTE — Progress Notes (Signed)
Office Visit Note   Patient: Michael West           Date of Birth: 02-16-1970           MRN: [id number] Visit Date: 09/21/2021              Requested by: Pearline Cables, MD [address] [place],  [place] [address] PCP: Pearline Cables, MD   Assessment & Plan: Visit Diagnoses:  1. Chronic pain of left ankle   2. Chronic right shoulder pain     Plan: Impression is right shoulder impingement syndrome and left ankle pain.  In regards to the shoulder, I discussed subacromial cortisone injection for which she would like to proceed.  If his symptoms do not improve over the next month or so he will let us know we will get an MRI to assess for rotator cuff pathology.  In regards to the ankle, would like for him to finish out his steroid.  I have also discussed putting him in a cam boot for which she is agreeable to.  He may wean out of this as tolerated.  Follow-up with [place] as needed.  Call with concerns or questions.  Follow-Up Instructions: Return if symptoms worsen or fail to improve.   Orders:  Orders Placed This Encounter  Procedures   Large Joint Inj   No orders of the defined types were placed in this encounter.     Procedures: Large Joint Inj: R subacromial bursa on 09/21/2021 3:58 PM Indications: pain Details: 22 G needle Medications: 2 mL lidocaine 2 %; 2 mL bupivacaine 0.25 %; 40 mg methylPREDNISolone acetate 40 MG/ML Outcome: tolerated well, no immediate complications Patient was prepped and draped in the usual sterile fashion.       Clinical Data: No additional findings.   Subjective: Chief Complaint  Patient presents with   Left Ankle - Pain   Right Shoulder - Pain    HPI patient is a pleasant 51 year old [place] gentleman who is here today with interpreter for right shoulder and left ankle pain.  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  In regards to the shoulder, the pain is to the proximal deltoid  and top of the shoulder.  He denies any specific injury at work but does note he lifts heavy boxes all day.  He has associated weakness.  Anytime he raises his arm seems to aggravate his symptoms.  He has been taking Advil which minimally helps.  No previous cortisone injection.  In regards to the left ankle, he has had pain for the past year after being hit in the ankle with a piece of work equipment.  The pain is to the lateral aspect worse with standing.  He has recently been taking prednisone with mild relief.  Review of Systems as detailed in HPI.  All others reviewed and are negative.   Objective: Vital Signs: There were no vitals taken for this visit.  Physical Exam well-developed well-nourished gentleman in no acute distress.  Alert and oriented x3.  Ortho Exam right shoulder exam reveals forward flexion to 120 degrees, abduction to 90 degrees, internal rotation to his back pocket.  Positive empty can.  Near full strength throughout.  Is neurovascular intact distally.  Left ankle exam reveals mild tenderness to the lateral malleolus.  Increased pain with plantarflexion, inversion and eversion.  Specialty Comments:  No specialty comments available.  Imaging: No results  found.   PMFS History: Patient Active Problem List   Diagnosis Date Noted   Abdominal wall pain in both upper quadrants 08/10/2015   Thoracic spine pain 08/10/2015   Chronic pain of left ankle 08/10/2015   Vitamin D deficiency 03/30/2012   Long-term use of immunosuppressant medication - azathioprine 02/17/2012   Autoimmune hepatitis (HCC) 08/25/2011   Past Medical History:  Diagnosis Date   Autoimmune hepatitis (HCC)    Unspecified vitamin D deficiency 03/30/2012   .03/2012 - Level is 14    Family History  Problem Relation Age of Onset   Colon cancer Mother     Past Surgical History:  Procedure Laterality Date   dental prosthesis     HEMORRHOID SURGERY  12/06/2000   PERCUTANEOUS LIVER BIOPSY      PROCTOSCOPY  12/06/2000   Internal and external hemorrhoidectomy   Social History   Occupational History    Employer: [employer]  Tobacco Use   Smoking status: Never   Smokeless tobacco: Never  Substance and Sexual Activity   Alcohol use: No   Drug use: No   Sexual activity: Yes

## 2021-09-27 ENCOUNTER — Telehealth: Payer: Self-pay | Admitting: Rheumatology

## 2021-09-27 NOTE — Telephone Encounter (Signed)
Opened in error

## 2021-10-05 ENCOUNTER — Ambulatory Visit (INDEPENDENT_AMBULATORY_CARE_PROVIDER_SITE_OTHER): Payer: 59 | Admitting: Orthopaedic Surgery

## 2021-10-05 ENCOUNTER — Encounter: Payer: Self-pay | Admitting: Orthopaedic Surgery

## 2021-10-05 DIAGNOSIS — G8929 Other chronic pain: Secondary | ICD-10-CM

## 2021-10-05 DIAGNOSIS — M25511 Pain in right shoulder: Secondary | ICD-10-CM

## 2021-10-05 MED ORDER — TRAMADOL HCL 50 MG PO TABS: 50.0000 mg | ORAL_TABLET | Freq: Three times a day (TID) | ORAL | 2 refills | Status: AC | PRN

## 2021-10-05 NOTE — Progress Notes (Signed)
Office Visit Note   Patient: Michael West           Date of Birth: Sep 02, 1970           MRN: [id number] Visit Date: 10/05/2021              Requested by: Pearline Cables, MD [address] [place],  [place] [address] PCP: Pearline Cables, MD   Assessment & Plan: Visit Diagnoses:  1. Chronic right shoulder pain     Plan: Impression is chronic right shoulder pain with concern of rotator cuff pathology.  At this point, he is only had temporary relief from subacromial cortisone injection in his not had any relief from physical therapy.  I would like to go ahead with an MRI to assess his rotator cuff.  He will follow-up with [place] once testing completed.  Call with concerns or questions.  Follow-Up Instructions: Return for f/u after MRI.   Orders:  No orders of the defined types were placed in this encounter.  Meds ordered this encounter  Medications   traMADol (ULTRAM) 50 MG tablet    Sig: Take 1 tablet (50 mg total) by mouth 3 (three) times daily as needed.    Dispense:  60 tablet    Refill:  2      Procedures: No procedures performed   Clinical Data: No additional findings.   Subjective: Chief Complaint  Patient presents with   Right Shoulder - Follow-up    HPI patient is a pleasant 51 year old [place] gentleman who is here today with interpreter.  He is here with recurrent right shoulder pain.  I saw him about 2 weeks ago for this and injected the subacromial space with cortisone.  He did have good relief but unfortunately this only lasted for a few days.  His symptoms have returned and have started to worsen.  Pain is still to the proximal deltoid and top of the shoulder and is worse with any activity.  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  He has previously tried a guided home exercise program for several months without relief.  No previous MRI of the right shoulder.  Review of  Systems as detailed in HPI.  All others reviewed and are negative.   Objective: Vital Signs: There were no vitals taken for this visit.  Physical Exam well-developed well-nourished gentleman no acute distress.  Alert and oriented x3.  Ortho Exam unchanged right shoulder exam  Specialty Comments:  No specialty comments available.  Imaging: No new imaging   PMFS History: Patient Active Problem List   Diagnosis Date Noted   Abdominal wall pain in both upper quadrants 08/10/2015   Thoracic spine pain 08/10/2015   Chronic pain of left ankle 08/10/2015   Vitamin D deficiency 03/30/2012   Long-term use of immunosuppressant medication - azathioprine 02/17/2012   Autoimmune hepatitis (HCC) 08/25/2011   Past Medical History:  Diagnosis Date   Autoimmune hepatitis (HCC)    Unspecified vitamin D deficiency 03/30/2012   .03/2012 - Level is 14    Family History  Problem Relation Age of Onset   Colon cancer Mother     Past Surgical History:  Procedure Laterality Date   dental prosthesis     HEMORRHOID SURGERY  12/06/2000   PERCUTANEOUS LIVER BIOPSY     PROCTOSCOPY  12/06/2000   Internal and external hemorrhoidectomy   Social History   Occupational History  Employer: [employer]  Tobacco Use   Smoking status: Never   Smokeless tobacco: Never  Substance and Sexual Activity   Alcohol use: No   Drug use: No   Sexual activity: Yes

## 2021-10-26 ENCOUNTER — Ambulatory Visit: Payer: Managed Care, Other (non HMO) | Admitting: Orthopaedic Surgery

## 2022-05-31 ENCOUNTER — Encounter: Payer: Self-pay | Admitting: Internal Medicine

## 2022-05-31 ENCOUNTER — Ambulatory Visit: Payer: 59 | Attending: Internal Medicine | Admitting: Internal Medicine

## 2022-05-31 VITALS — BP 109/74 | HR 71 | Resp 14 | Ht 68.0 in | Wt 162.0 lb

## 2022-05-31 DIAGNOSIS — K754 Autoimmune hepatitis: Secondary | ICD-10-CM | POA: Diagnosis not present

## 2022-05-31 DIAGNOSIS — M25431 Effusion, right wrist: Secondary | ICD-10-CM | POA: Diagnosis not present

## 2022-05-31 DIAGNOSIS — M79671 Pain in right foot: Secondary | ICD-10-CM | POA: Diagnosis not present

## 2022-05-31 DIAGNOSIS — M0600 Rheumatoid arthritis without rheumatoid factor, unspecified site: Secondary | ICD-10-CM | POA: Insufficient documentation

## 2022-05-31 DIAGNOSIS — R700 Elevated erythrocyte sedimentation rate: Secondary | ICD-10-CM

## 2022-05-31 DIAGNOSIS — Z79899 Other long term (current) drug therapy: Secondary | ICD-10-CM

## 2022-05-31 DIAGNOSIS — M79672 Pain in left foot: Secondary | ICD-10-CM

## 2022-05-31 DIAGNOSIS — M25432 Effusion, left wrist: Secondary | ICD-10-CM

## 2022-05-31 MED ORDER — PREDNISONE 10 MG PO TABS: 10.0000 mg | ORAL_TABLET | Freq: Every day | ORAL | 1 refills | Status: DC

## 2022-05-31 NOTE — Progress Notes (Signed)
Office Visit Note  Patient: Michael West             Date of Birth: [birth year]           MRN: [id number]             PCP: Patient, No Pcp Per Referring: Ceasar Lund, PA Visit Date: 05/31/2022 Occupation: Medication packaging  Subjective:  New Patient (Initial Visit) (Patient states he has pain and burning on the bottoms of his feet. Patient states walking aggravates his feet. Patient states when he drives it is hard to press the break with force.)   History of Present Illness: Michael West is a 52 y.o. male here for evaluation of inflammatory joint pain after onset last year with several months of shoulder, wrist, and ankle pains.  Symptoms have been problematic since last year and he was evaluated in PCP office in August for this had already been ongoing for several months.  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  He had highly elevated sedimentation rate and CRP and initial treatment with Depo-Medrol injection and a steroid taper.  Since then symptoms have been ongoing he gets pretty good relief while on steroids but whenever he finishes the prescribed medication or skips taking them symptoms quickly get worse again.  Most recently taking 20 mg daily as needed which is effective.  Besides this he takes Advil which is partially effective.  He has some pain with decreased range of motion mostly in the right shoulder and both hands unable to fully tighten his grip.  But his other most problematic symptom is sharp or burning pain in his feet and ankles.  This is worse whenever he is standing or walking for prolonged time and improves when he can take weight off of his feet.  Not associated with as much visible swelling.  Also occasionally gets a sensation like his legs are very weak or locked up and has difficulty moving for up to 2 hours at a time. He has a past history of autoimmune hepatitis which  was treated by Dr. Leone Payor with azathioprine after diagnosis in 2013 but off maintenance treatment since 2017 due to disease remission.  There was concern for associated arthralgias apparently referred to Dr. Kellie Simmering for symptoms at the time.  Mr. Mose does not recall some of the specifics from that time.  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   Patient Reports nocturnal pain.  Difficulty dressing/grooming: Reports Difficulty climbing stairs: Reports Difficulty getting out of chair: Reports Difficulty using hands for taps, buttons, cutlery, and/or writing: Reports  Review of Systems  Constitutional:  Negative for fatigue.  HENT:  Negative for mouth sores and mouth dryness.   Eyes:  Negative for dryness.  Respiratory:  Negative for shortness of breath.   Cardiovascular:  Positive for palpitations. Negative for chest pain.  Gastrointestinal:  Positive for constipation. Negative for blood in stool and diarrhea.  Endocrine: Negative for increased urination.  Genitourinary:  Positive for involuntary urination.  Musculoskeletal:  Positive for joint pain, gait problem, joint pain, myalgias, muscle weakness, morning stiffness, muscle tenderness and myalgias. Negative for joint swelling.  Skin:  Negative for color change, rash, hair loss and sensitivity to sunlight.  Allergic/Immunologic: Negative for susceptible to infections.  Neurological:  Positive for dizziness. Negative for headaches.  Hematological:  Negative for swollen glands.  Psychiatric/Behavioral:  Positive for sleep disturbance. Negative for depressed mood. The patient is not nervous/anxious.     PMFS History:   Patient Active Problem List   Diagnosis Date Noted   Sedimentation rate elevation 05/31/2022   Swelling of both wrists 05/31/2022   Bilateral foot pain 05/31/2022   Abdominal wall pain in both upper quadrants 08/10/2015   Thoracic spine pain 08/10/2015   Chronic pain of left ankle 08/10/2015   Vitamin D deficiency 03/30/2012   Long-term use of immunosuppressant medication - azathioprine 02/17/2012   Autoimmune hepatitis 08/25/2011    Past Medical History:  Diagnosis Date   Autoimmune hepatitis    Unspecified vitamin D deficiency 03/30/2012   .03/2012 - Level is 14    Family History  Problem Relation Age of Onset   Colon cancer Mother    Past Surgical History:  Procedure Laterality Date   dental prosthesis     HEMORRHOID SURGERY  12/06/2000   PERCUTANEOUS LIVER BIOPSY     PROCTOSCOPY  12/06/2000   Internal and external hemorrhoidectomy   Social History   Social History Narrative   Moved to the [place] from [place] in 11/1996.   Married   Immunization History  Administered Date(s) Administered   Influenza Split 11/01/2011   Influenza,inj,Quad PF,6+ Mos 12/06/2012   Influenza-Unspecified 12/23/2014   Pneumococcal Conjugate-13 08/10/2015   Pneumococcal Polysaccharide-23 09/07/2011   Tdap 09/07/2011     Objective: Vital Signs: BP 109/74 (BP Location: Right Arm, Patient Position: Sitting, Cuff Size: Normal)   Pulse 71   Resp 14   Ht  (1.727 m)   Wt 162 lb (73.5 kg)   BMI 24.63 kg/m    Physical Exam HENT:     Mouth/Throat:     Mouth: Mucous membranes are moist.     Pharynx: Oropharynx is clear.  Eyes:     Conjunctiva/sclera: Conjunctivae normal.  Cardiovascular:     Rate and Rhythm: Normal rate and regular rhythm.  Pulmonary:     Effort: Pulmonary effort is normal.     Breath sounds: Normal breath sounds.  Musculoskeletal:     Right lower leg: No edema.     Left lower leg: No edema.  Lymphadenopathy:     Cervical: No cervical adenopathy.  Skin:     General: Skin is warm and dry.     Findings: No rash.  Neurological:     Mental Status: He is alert.      Musculoskeletal Exam:  Right shoulder pain with palpation, pain with overhead abduction and with external rotation, no palpable synovitis Elbows full ROM no tenderness or swelling Bilateral wrist swelling palpable on dorsal side, tenderness to pressure Fingers MCP joint swelling, tenderness, grip ROM about 80%, no PIP or DIP synovitis Hip normal internal and external rotation without pain, no tenderness to lateral hip palpation Knees full ROM no tenderness or swelling No palpable swelling in ankles or MTP joints, tenderness to pressure throughout from above ankle and throughout foot   Investigation: No additional findings.  Imaging: No results found.  Recent Labs: Lab Results  Component Value Date   WBC 9.0 08/10/2015   HGB 12.9 (L) 08/10/2015   PLT 222.0 08/10/2015   NA 136 08/10/2015   K 3.8 08/10/2015   CL 103 08/10/2015   CO2 27  08/10/2015   GLUCOSE 78 08/10/2015   BUN 9 08/10/2015   CREATININE 0.70 08/10/2015   BILITOT 0.6 08/10/2015   BILITOT 0.6 08/10/2015   ALKPHOS 82 08/10/2015   ALKPHOS 82 08/10/2015   AST 21 08/10/2015   AST 21 08/10/2015   ALT 14 08/10/2015   ALT 14 08/10/2015   PROT 8.6 (H) 08/10/2015   PROT 8.6 (H) 08/10/2015   ALBUMIN 4.5 08/10/2015   ALBUMIN 4.5 08/10/2015   CALCIUM 9.7 08/10/2015   GFRAA >90 08/31/2011    Speciality Comments: No specialty comments available.  Procedures:  No procedures performed Allergies: Patient has no known allergies.   Assessment / Plan:     Visit Diagnoses: Swelling of both wrists  Sedimentation rate elevation - Plan: Sedimentation rate, Cyclic citrul peptide antibody, IgG, ANA, C3 and C4, predniSONE (DELTASONE) 10 MG tablet  Definite synovitis present in both hands and wrists considerable right shoulder pain without palpable effusion but also suspect inflammatory process with normal recent  x-ray.  Will check CCP antibody for may be seronegative RA.  Previous history of highly positive ANA associated with the autoimmune hepatitis in the past we will recheck this and serum complements.  Also rechecking sedimentation rate to see if this is improving at all compared to 75 last year.  If there is any indication of hepatic involvement would recommend trial of restarting azathioprine versus could be start with hydroxychloroquine if normal.  Recommend decreasing prednisone to 10 mg but taking this daily scheduled for maintenance while starting longer-term medication.  Autoimmune hepatitis  Checking complete metabolic panel as above have been in remission for years and from review of records it was not having objective joint inflammation like this during his previous disease activity.  Bilateral foot pain  Foot pain may be inflammatory along with the other issues though there is not much synovitis and the described pain is questionable for neuropathy as it does not localize well to the joints.  If he does not see improvement with this symptom along with the others may need to add something specifically for neuropathic pain.  High risk medication use - Plan: CBC with Differential/Platelet, COMPLETE METABOLIC PANEL WITH GFR, IgG, IgA, IgM, QuantiFERON-TB Gold Plus  Checking CBC CMP and QuantiFERON baseline medication monitoring for antirheumatic DMARD.  Previously had full hepatitis evaluation in the past during autoimmune hepatitis treatment which was fine.  Orders: Orders Placed This Encounter  Procedures   CBC with Differential/Platelet   COMPLETE METABOLIC PANEL WITH GFR   Sedimentation rate   Cyclic citrul peptide antibody, IgG   ANA   C3 and C4   IgG, IgA, IgM   QuantiFERON-TB Gold Plus   Meds ordered this encounter  Medications   predniSONE (DELTASONE) 10 MG tablet    Sig: Take 1 tablet (10 mg total) by mouth daily with breakfast.    Dispense:  30 tablet    Refill:  1     Follow-Up Instructions: Return in about 2 months (around 07/31/2022) for New pt ?RA/?AIH f/u 2mos.   Fuller Plan, MD  Note - This record has been created using [employer].  Chart creation errors have been sought, but may not always  have been located. Such creation errors do not reflect on  the standard of medical care.

## 2022-06-03 ENCOUNTER — Other Ambulatory Visit: Payer: Self-pay

## 2022-06-03 DIAGNOSIS — M25431 Effusion, right wrist: Secondary | ICD-10-CM

## 2022-06-03 LAB — COMPLETE METABOLIC PANEL WITH GFR
AG Ratio: 1.3 (calc) (ref 1.0–2.5)
ALT: 6 U/L — ABNORMAL LOW (ref 9–46)
AST: 14 U/L (ref 10–35)
Albumin: 4.2 g/dL (ref 3.6–5.1)
Alkaline phosphatase (APISO): 63 U/L (ref 35–144)
BUN/Creatinine Ratio: 27 (calc) — ABNORMAL HIGH (ref 6–22)
BUN: 14 mg/dL (ref 7–25)
CO2: 25 mmol/L (ref 20–32)
Calcium: 9.1 mg/dL (ref 8.6–10.3)
Chloride: 104 mmol/L (ref 98–110)
Creat: 0.51 mg/dL — ABNORMAL LOW (ref 0.70–1.30)
Globulin: 3.3 g/dL (calc) (ref 1.9–3.7)
Glucose, Bld: 100 mg/dL — ABNORMAL HIGH (ref 65–99)
Potassium: 3.9 mmol/L (ref 3.5–5.3)
Sodium: 138 mmol/L (ref 135–146)
Total Bilirubin: 0.3 mg/dL (ref 0.2–1.2)
Total Protein: 7.5 g/dL (ref 6.1–8.1)
eGFR: 121 mL/min/{1.73_m2} (ref >=60)

## 2022-06-03 LAB — ANTI-NUCLEAR AB-TITER (ANA TITER): ANA Titer 1: 1:40 {titer} — ABNORMAL HIGH

## 2022-06-03 LAB — CBC WITH DIFFERENTIAL/PLATELET
Absolute Monocytes: 348 cells/uL (ref 200–950)
Basophils Absolute: 31 cells/uL (ref 0–200)
Basophils Relative: 0.5 %
Eosinophils Absolute: 177 cells/uL (ref 15–500)
Eosinophils Relative: 2.9 %
HCT: 36.1 % — ABNORMAL LOW (ref 38.5–50.0)
Hemoglobin: 11.8 g/dL — ABNORMAL LOW (ref 13.2–17.1)
Lymphs Abs: 3172 cells/uL (ref 850–3900)
MCH: 27.4 pg (ref 27.0–33.0)
MCHC: 32.7 g/dL (ref 32.0–36.0)
MCV: 83.8 fL (ref 80.0–100.0)
MPV: 9.6 fL (ref 7.5–12.5)
Monocytes Relative: 5.7 %
Neutro Abs: 2373 cells/uL (ref 1500–7800)
Neutrophils Relative %: 38.9 %
Platelets: 200 10*3/uL (ref 140–400)
RBC: 4.31 10*6/uL (ref 4.20–5.80)
RDW: 14.4 % (ref 11.0–15.0)
Total Lymphocyte: 52 %
WBC: 6.1 10*3/uL (ref 3.8–10.8)

## 2022-06-03 LAB — IGG, IGA, IGM
IgG (Immunoglobin G), Serum: 2079 mg/dL — ABNORMAL HIGH (ref 600–1640)
IgM, Serum: 243 mg/dL (ref 50–300)
Immunoglobulin A: 125 mg/dL (ref 47–310)

## 2022-06-03 LAB — SEDIMENTATION RATE: Sed Rate: 63 mm/h — ABNORMAL HIGH (ref 0–20)

## 2022-06-03 LAB — QUANTIFERON-TB GOLD PLUS
Mitogen-NIL: 4.98 IU/mL
NIL: 0.03 IU/mL
QuantiFERON-TB Gold Plus: NEGATIVE
TB1-NIL: 0 IU/mL
TB2-NIL: 0 IU/mL

## 2022-06-03 LAB — C3 AND C4
C3 Complement: 92 mg/dL (ref 82–185)
C4 Complement: 18 mg/dL (ref 15–53)

## 2022-06-03 LAB — ANA: Anti Nuclear Antibody (ANA): POSITIVE — AB

## 2022-06-03 LAB — CYCLIC CITRUL PEPTIDE ANTIBODY, IGG: Cyclic Citrullin Peptide Ab: <16 UNITS

## 2022-06-03 MED ORDER — PREDNISONE 10 MG PO TABS: 10.0000 mg | ORAL_TABLET | Freq: Every day | ORAL | 1 refills | Status: DC

## 2022-06-03 NOTE — Telephone Encounter (Signed)
Patient's friend Eudelia Bunch contacted the office and states the Prednisone prescription needs to be resent to [facility] on [address]. because [facility] will not cover the [employer]. Please review and sign.

## 2022-07-11 MED ORDER — HYDROXYCHLOROQUINE SULFATE 200 MG PO TABS: 200.0000 mg | ORAL_TABLET | Freq: Every day | ORAL | 1 refills | Status: DC

## 2022-07-11 NOTE — Addendum Note (Signed)
Addended by: Fuller Plan on: 07/11/2022 09:39 PM   Modules accepted: Orders

## 2022-07-15 ENCOUNTER — Encounter: Payer: Self-pay | Admitting: *Deleted

## 2022-07-31 ENCOUNTER — Other Ambulatory Visit: Payer: Self-pay | Admitting: Internal Medicine

## 2022-07-31 DIAGNOSIS — M25431 Effusion, right wrist: Secondary | ICD-10-CM

## 2022-08-01 NOTE — Telephone Encounter (Signed)
Last Fill: 06/03/2022  Next Visit: 08/05/2022  Last Visit: 05/31/2022  Dx: Swelling of both wrists   Current Dose per office note on 05/31/2022: prednisone to 10 mg   Okay to refill Prednisone?

## 2022-08-04 NOTE — Progress Notes (Signed)
Office Visit Note  Patient: Michael West             Date of Birth: Jul 31, 1970           MRN: [id number]             PCP: Patient, No Pcp Per Referring: No ref. provider found Visit Date: 08/05/2022   Subjective:  Follow-up   History of Present Illness: Michael West is a 52 y.o. 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  So far addition of hydroxychloroquine he has not seen a significant improvement.  He still has persistent swelling in his hands and his feet.  He is very stiff and has cramping in the morning for at least 20 minutes.  Also gets pain on the bottom of his feet especially near the toes with burning stinging sensation.  He has some limitation at work frequently has to go slower than normal because of his joint stiffness and gets increased pain so impacting his productivity.  Did not notice any trouble taking the medication.  Encounter was conducted with assistance of [place] phone interpreter line.  Previous HPI 05/31/22 Michael West is a 52 y.o. male here for evaluation of inflammatory joint pain after onset last year with several months of shoulder, wrist, and ankle pains.  Symptoms have been problematic since last year and he was evaluated in PCP office in August for this had already been ongoing for several months.  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  He had highly elevated sedimentation rate and CRP and initial treatment with Depo-Medrol injection and a steroid taper.  Since then symptoms have been ongoing he gets pretty good relief while on steroids but whenever he finishes the prescribed medication or skips taking them symptoms quickly get worse again.  Most recently taking 20 mg daily as needed which is effective.  Besides this he takes Advil which is partially effective.  He  has some pain with decreased range of motion mostly in the right shoulder and both hands unable to fully tighten his grip.  But his other most problematic symptom is sharp or burning pain in his feet and ankles.  This is worse whenever he is standing or walking for prolonged time and improves when he can take weight off of his feet.  Not associated with as much visible swelling.  Also occasionally gets a sensation like his legs are very weak or locked up and has difficulty moving for up to 2 hours at a time. He has a past history of autoimmune hepatitis which was treated by Michael West with azathioprine after diagnosis in 2013 but off maintenance treatment since 2017 due to disease remission.  There was concern for associated arthralgias apparently referred to Michael West for symptoms at the time.  Michael West does not recall some of the specifics from that time.   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  HENT:  Negative for mouth sores and mouth dryness.   Eyes:  Negative for dryness.  Respiratory:  Positive for shortness of breath.   Cardiovascular:  Positive for palpitations. Negative for chest pain.  Gastrointestinal:  Positive for constipation. Negative for blood in stool and diarrhea.  Endocrine: Negative for increased urination.  Genitourinary:  Negative for involuntary urination.  Musculoskeletal:  Positive for joint pain, joint pain, myalgias, muscle tenderness and myalgias. Negative for gait problem, joint swelling, muscle weakness and morning stiffness.  Skin:  Positive for hair loss. Negative for color change, rash and sensitivity to sunlight.  Allergic/Immunologic: Negative for  susceptible to infections.  Neurological:  Positive for dizziness and headaches.  Hematological:  Negative for swollen glands.  Psychiatric/Behavioral:  Positive for sleep disturbance. Negative for depressed mood. The patient is not nervous/anxious.     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/31/2022   Bilateral foot pain 05/31/2022   Abdominal wall pain in both upper quadrants 08/10/2015   Thoracic spine pain 08/10/2015   Chronic pain of left ankle 08/10/2015   Vitamin D deficiency 03/30/2012   Long-term use of immunosuppressant medication - azathioprine 02/17/2012   Autoimmune hepatitis (HCC) 08/25/2011    Past Medical History:  Diagnosis Date   Autoimmune hepatitis (HCC)    Liver infection    Unspecified vitamin D deficiency 03/30/2012   .03/2012 - Level is 14    Family History  Problem Relation Age of Onset   Colon cancer Mother    Past Surgical History:  Procedure Laterality Date   dental prosthesis     HEMORRHOID SURGERY  12/06/2000   PERCUTANEOUS LIVER BIOPSY     PROCTOSCOPY  12/06/2000   Internal and external hemorrhoidectomy   Social History   Social History Narrative   Moved to the [place] from [place] in 11/1996.   Married   Immunization History  Administered Date(s) Administered   Influenza Split 11/01/2011   Influenza,inj,Quad PF,6+ Mos 12/06/2012   Influenza-Unspecified 12/23/2014   Pneumococcal Conjugate-13 08/10/2015   Pneumococcal Polysaccharide-23 09/07/2011   Tdap 09/07/2011     Objective: Vital Signs: BP 107/74 (BP Location: Left Arm, Patient Position: Sitting, Cuff Size: Normal)   Pulse 62   Resp 14   Ht 5\' 8"  (1.727 m)   Wt 174 lb (78.9 kg)   BMI 26.46 kg/m    Physical Exam Cardiovascular:     Rate and Rhythm: Normal rate and regular rhythm.  Pulmonary:     Effort: Pulmonary effort is normal.     Breath sounds: Normal  breath sounds.  Musculoskeletal:     Right lower leg: No edema.     Left lower leg: No edema.  Skin:    General: Skin is warm and dry.     Findings: No rash.  Neurological:     Mental Status: He is alert.  Psychiatric:        Mood and Affect: Mood normal.      Musculoskeletal Exam:  Shoulders full ROM no tenderness or swelling Elbows full ROM no tenderness or swelling Bilateral wrist swelling tenderness and decreased range of motion Finger tenderness and swelling throughout second third digits on left hand and first MCP, less severe on right with second third MCPs swelling Knees full ROM right knee medial joint line tenderness to pressure, no palpable effusion Positive for MTP squeeze tenderness and tenderness along the plantar side   CDAI Exam: CDAI Score: 25  Patient Global: 30 / 100; Provider Global: 40 / 100 Swollen: 9 ; Tender: 9  Joint Exam 08/05/2022  Right  Left  Wrist  Swollen Tender  Swollen Tender  MCP 1     Swollen   MCP 2  Swollen Tender  Swollen Tender  MCP 3  Swollen Tender  Swollen Tender  PIP 2     Swollen Tender  PIP 3     Swollen Tender  Knee   Tender          Investigation: No additional findings.  Imaging: No results found.  Recent Labs: Lab Results  Component Value Date   WBC 6.1 05/31/2022   HGB 11.8 (L) 05/31/2022   PLT 200 05/31/2022   NA 138 05/31/2022   K 3.9 05/31/2022   CL 104 05/31/2022   CO2 25 05/31/2022   GLUCOSE 100 (H) 05/31/2022   BUN 14 05/31/2022   CREATININE 0.51 (L) 05/31/2022   BILITOT 0.3 05/31/2022   ALKPHOS 82 08/10/2015   ALKPHOS 82 08/10/2015   AST 14 05/31/2022   ALT 6 (L) 05/31/2022   PROT 7.5 05/31/2022   ALBUMIN 4.5 08/10/2015   ALBUMIN 4.5 08/10/2015   CALCIUM 9.1 05/31/2022   GFRAA >90 08/31/2011   QFTBGOLDPLUS NEGATIVE 05/31/2022    Speciality Comments: No specialty comments available.  Procedures:  No procedures performed Allergies: Patient has no known allergies.   Assessment /  Plan:     Visit Diagnoses: Seronegative rheumatoid arthritis (HCC) - Plan: CBC with Differential/Platelet, COMPLETE METABOLIC PANEL WITH GFR, predniSONE (DELTASONE) 10 MG tablet, azaTHIOprine (IMURAN) 50 MG tablet  Joint inflammation looks very consistent for seronegative rheumatoid arthritis still high disease activity despite 10 mg prednisone so no real improvement after about 2 months with hydroxychloroquine.  Will recheck sed rate for disease activity assessment see if there was any decrease.  Plan to start on azathioprine 100 mg daily continue prednisone 10 mg daily.  Bilateral foot pain  Current pain also is consistent with active RA with inflammation worst at MTP joints.  Burning stinging type sensation could have a component of peripheral neuropathy or edema but not recommending other specific symptomatic treatments until we get his inflammation under control.  High risk medication use  Reviewed risks of azathioprine including leukopenia or increased risk of infections or malignancy.  He previously tolerated medication as a treatment for his autoimmune hepatitis in the past with Michael West so I doubt he would have severe drug reaction.  Checking CBC and CMP baseline today for medication monitoring.  Avoiding methotrexate or leflunomide due to history of prior hepatitis.  Orders: Orders Placed This Encounter  Procedures   Sedimentation rate   CBC with Differential/Platelet   COMPLETE METABOLIC PANEL WITH GFR   Meds ordered this encounter  Medications   predniSONE (DELTASONE) 10 MG tablet    Sig: Take 1 tablet (10 mg total) by mouth daily with breakfast.    Dispense:  30 tablet    Refill:  1   azaTHIOprine (IMURAN) 50 MG tablet    Sig: Take 1 tablet (50 mg total) by mouth daily.    Dispense:  60 tablet    Refill:  1     Follow-Up Instructions: Return in about 8 weeks (around 09/30/2022) for Ra AZA/GC f/u 2mos.   Fuller Plan, MD  Note - This record has been created  using [employer].  Chart creation errors have been sought, but may not always  have been located. Such creation errors do not reflect on  the standard of medical care.

## 2022-08-05 ENCOUNTER — Ambulatory Visit: Payer: 59 | Attending: Internal Medicine | Admitting: Internal Medicine

## 2022-08-05 ENCOUNTER — Encounter: Payer: Self-pay | Admitting: Internal Medicine

## 2022-08-05 VITALS — BP 107/74 | HR 62 | Resp 14 | Ht 68.0 in | Wt 174.0 lb

## 2022-08-05 DIAGNOSIS — Z7960 Long term (current) use of unspecified immunomodulators and immunosuppressants: Secondary | ICD-10-CM

## 2022-08-05 DIAGNOSIS — M25431 Effusion, right wrist: Secondary | ICD-10-CM | POA: Diagnosis not present

## 2022-08-05 DIAGNOSIS — M0600 Rheumatoid arthritis without rheumatoid factor, unspecified site: Secondary | ICD-10-CM

## 2022-08-05 DIAGNOSIS — R700 Elevated erythrocyte sedimentation rate: Secondary | ICD-10-CM | POA: Diagnosis not present

## 2022-08-05 DIAGNOSIS — M25432 Effusion, left wrist: Secondary | ICD-10-CM

## 2022-08-05 MED ORDER — PREDNISONE 10 MG PO TABS: 10.0000 mg | ORAL_TABLET | Freq: Every day | ORAL | 1 refills | Status: DC

## 2022-08-05 MED ORDER — AZATHIOPRINE 50 MG PO TABS: 50.0000 mg | ORAL_TABLET | Freq: Every day | ORAL | 1 refills | Status: AC

## 2022-08-06 LAB — CBC WITH DIFFERENTIAL/PLATELET
Absolute Monocytes: 462 cells/uL (ref 200–950)
Basophils Absolute: 33 cells/uL (ref 0–200)
Basophils Relative: 0.5 %
Eosinophils Absolute: 189 cells/uL (ref 15–500)
Eosinophils Relative: 2.9 %
HCT: 35.4 % — ABNORMAL LOW (ref 38.5–50.0)
Hemoglobin: 11.7 g/dL — ABNORMAL LOW (ref 13.2–17.1)
Lymphs Abs: 3393 cells/uL (ref 850–3900)
MCH: 28.8 pg (ref 27.0–33.0)
MCHC: 33.1 g/dL (ref 32.0–36.0)
MCV: 87.2 fL (ref 80.0–100.0)
MPV: 9.5 fL (ref 7.5–12.5)
Monocytes Relative: 7.1 %
Neutro Abs: 2425 cells/uL (ref 1500–7800)
Neutrophils Relative %: 37.3 %
Platelets: 204 10*3/uL (ref 140–400)
RBC: 4.06 10*6/uL — ABNORMAL LOW (ref 4.20–5.80)
RDW: 12.6 % (ref 11.0–15.0)
Total Lymphocyte: 52.2 %
WBC: 6.5 10*3/uL (ref 3.8–10.8)

## 2022-08-06 LAB — COMPLETE METABOLIC PANEL WITH GFR
AG Ratio: 1.2 (calc) (ref 1.0–2.5)
ALT: 8 U/L — ABNORMAL LOW (ref 9–46)
AST: 14 U/L (ref 10–35)
Albumin: 4 g/dL (ref 3.6–5.1)
Alkaline phosphatase (APISO): 79 U/L (ref 35–144)
BUN/Creatinine Ratio: 24 (calc) — ABNORMAL HIGH (ref 6–22)
BUN: 16 mg/dL (ref 7–25)
CO2: 24 mmol/L (ref 20–32)
Calcium: 8.9 mg/dL (ref 8.6–10.3)
Chloride: 105 mmol/L (ref 98–110)
Creat: 0.67 mg/dL — ABNORMAL LOW (ref 0.70–1.30)
Globulin: 3.4 g/dL (calc) (ref 1.9–3.7)
Glucose, Bld: 97 mg/dL (ref 65–99)
Potassium: 4.1 mmol/L (ref 3.5–5.3)
Sodium: 138 mmol/L (ref 135–146)
Total Bilirubin: 0.3 mg/dL (ref 0.2–1.2)
Total Protein: 7.4 g/dL (ref 6.1–8.1)
eGFR: 111 mL/min/{1.73_m2} (ref >=60)

## 2022-08-06 LAB — SEDIMENTATION RATE: Sed Rate: 56 mm/h — ABNORMAL HIGH (ref 0–20)

## 2022-09-30 ENCOUNTER — Ambulatory Visit: Payer: 59 | Attending: Internal Medicine | Admitting: Internal Medicine

## 2022-09-30 ENCOUNTER — Encounter: Payer: Self-pay | Admitting: Internal Medicine

## 2022-09-30 VITALS — BP 110/74 | HR 64 | Resp 14 | Ht 68.0 in | Wt 177.0 lb

## 2022-09-30 DIAGNOSIS — Z7952 Long term (current) use of systemic steroids: Secondary | ICD-10-CM

## 2022-09-30 DIAGNOSIS — M79671 Pain in right foot: Secondary | ICD-10-CM

## 2022-09-30 DIAGNOSIS — Z79899 Other long term (current) drug therapy: Secondary | ICD-10-CM

## 2022-09-30 DIAGNOSIS — M79672 Pain in left foot: Secondary | ICD-10-CM

## 2022-09-30 DIAGNOSIS — M0600 Rheumatoid arthritis without rheumatoid factor, unspecified site: Secondary | ICD-10-CM

## 2022-09-30 NOTE — Progress Notes (Unsigned)
Office Visit Note  Patient: Michael West             Date of Birth: [birth year]           MRN: [id number]             PCP: Patient, No Pcp Per Referring: No ref. provider found Visit Date: 09/30/2022   Subjective:  Follow-up (The patient stated the medication is helping him. )   History of Present Illness: Michael West is a 52 y.o. 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 He has been taking the medicine only on some days since he has no significant swelling and pain on others. He stopped taking hydroxychloroquine and prednisone without any exacerbation of the symptoms. Has not gotten sick and no intolerance to the mediation thus far.    Previous HPI 08/05/22 Michael West is a 51 y.o. 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  So far addition of hydroxychloroquine he has not seen a significant improvement.  He still has persistent swelling in his hands and his feet.  He is very stiff and has cramping in the morning for at least 20 minutes.  Also gets pain on the bottom of his feet especially near the toes with burning stinging sensation.  He has some limitation at work frequently has to go slower than normal because of his joint stiffness and gets increased pain so impacting his productivity.  Did not notice any trouble taking the medication.   Encounter was conducted with assistance of [place] phone interpreter line.   Previous HPI 05/31/22 Michael West is a 52 y.o. male here for evaluation of inflammatory joint pain after onset last year with several months of shoulder, wrist, and ankle pains.  Symptoms have been problematic since last year and he was evaluated in PCP office in August for this had already been ongoing for several months.  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  He had highly elevated sedimentation rate and CRP and initial treatment with Depo-Medrol injection and a steroid taper.  Since then symptoms have been ongoing he gets pretty good relief while on steroids but whenever he finishes the prescribed medication or skips taking them symptoms quickly get worse again.  Most recently taking 20 mg daily as needed which is effective.  Besides this he takes Advil which is partially effective.  He has some pain with decreased range of motion mostly in the right shoulder and both hands unable to fully tighten his grip.  But his other most problematic symptom is sharp or burning pain in his feet and ankles.  This is worse whenever he is standing or walking for prolonged time and improves when he can take weight off of his feet.  Not associated with as much visible swelling.  Also occasionally gets a sensation like his legs are very weak or locked up and has difficulty moving for up to 2 hours at a time. He has a past history of autoimmune hepatitis which was treated by Dr. Leone Payor with azathioprine after diagnosis in 2013 but off maintenance treatment since 2017 due to disease remission.  There was concern for associated arthralgias apparently referred to Dr. Kellie Simmering for symptoms at the time.  Mr. Bobb does not recall some of the specifics from that time.  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  HENT:  Negative for mouth sores and mouth dryness.   Eyes:  Negative for dryness.  Respiratory:  Positive for shortness of breath.   Cardiovascular:  Negative for chest pain and palpitations.  Gastrointestinal:  Positive for constipation. Negative for blood in  stool and diarrhea.  Endocrine: Positive for increased urination.  Genitourinary:  Negative for involuntary urination.  Musculoskeletal:  Positive for muscle weakness. Negative for joint pain, gait problem, joint pain, joint swelling, myalgias, morning stiffness, muscle tenderness and myalgias.  Skin:  Positive for rash. Negative for color change, hair loss and sensitivity to sunlight.  Allergic/Immunologic: Negative for susceptible to infections.  Neurological:  Negative for dizziness and headaches.  Hematological:  Negative for swollen glands.  Psychiatric/Behavioral:  Negative for depressed mood and sleep disturbance. The patient is not nervous/anxious.     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/31/2022   Bilateral foot pain 05/31/2022   Abdominal wall pain in both upper quadrants 08/10/2015   Thoracic spine pain 08/10/2015   Chronic pain of left ankle 08/10/2015   Vitamin D deficiency 03/30/2012   Long-term use of immunosuppressant medication - azathioprine 02/17/2012   Autoimmune hepatitis (HCC) 08/25/2011    Past Medical History:  Diagnosis Date   Autoimmune hepatitis (HCC)    Liver infection    Unspecified vitamin D deficiency 03/30/2012   .03/2012 - Level is 14    Family History  Problem Relation Age of Onset   Colon cancer Mother    Past Surgical History:  Procedure Laterality Date   dental prosthesis     HEMORRHOID SURGERY  12/06/2000   PERCUTANEOUS LIVER BIOPSY     PROCTOSCOPY  12/06/2000   Internal and external hemorrhoidectomy   Social History   Social History Narrative   Moved to the [place] from [place] in 11/1996.   Married   Immunization History  Administered Date(s) Administered   Influenza Split 11/01/2011   Influenza,inj,Quad PF,6+ Mos 12/06/2012   Influenza-Unspecified 12/23/2014   Pneumococcal Conjugate-13 08/10/2015   Pneumococcal  Polysaccharide-23 09/07/2011   Tdap 09/07/2011     Objective: Vital Signs: BP 110/74 (BP Location: Left Arm, Patient Position: Sitting, Cuff Size: Normal)   Pulse 64   Resp 14   Ht 5\' 8"  (1.727 m)   Wt 177 lb (80.3 kg)   BMI 26.91 kg/m    Physical Exam HENT:     Mouth/Throat:     Mouth: Mucous membranes are moist.     Pharynx: Oropharynx is clear.  Eyes:     Conjunctiva/sclera: Conjunctivae normal.  Cardiovascular:     Rate and Rhythm: Normal rate and regular rhythm.  Pulmonary:     Effort: Pulmonary effort is normal.     Breath sounds: Normal breath sounds.  Musculoskeletal:     Right lower leg: No edema.     Left lower leg: No edema.  Lymphadenopathy:     Cervical: No cervical adenopathy.  Skin:    General: Skin is warm and dry.     Findings: No rash.  Neurological:     Mental Status: He is alert.  Psychiatric:  Mood and Affect: Mood normal.      Musculoskeletal Exam:  Shoulders full ROM no tenderness or swelling Elbows full ROM no tenderness or swelling Wrists full ROM no tenderness or swelling Fingers full ROM, left 2nd MCP chronic soft tissue thickening no palpable swelling Knees full ROM no tenderness or swelling   Investigation: No additional findings.  Imaging: No results found.  Recent Labs: Lab Results  Component Value Date   WBC 6.5 08/05/2022   HGB 11.7 (L) 08/05/2022   PLT 204 08/05/2022   NA 138 08/05/2022   K 4.1 08/05/2022   CL 105 08/05/2022   CO2 24 08/05/2022   GLUCOSE 97 08/05/2022   BUN 16 08/05/2022   CREATININE 0.67 (L) 08/05/2022   BILITOT 0.3 08/05/2022   ALKPHOS 82 08/10/2015   ALKPHOS 82 08/10/2015   AST 14 08/05/2022   ALT 8 (L) 08/05/2022   PROT 7.4 08/05/2022   ALBUMIN 4.5 08/10/2015   ALBUMIN 4.5 08/10/2015   CALCIUM 8.9 08/05/2022   GFRAA >90 08/31/2011   QFTBGOLDPLUS NEGATIVE 05/31/2022    Speciality Comments: No specialty comments available.  Procedures:  No procedures performed Allergies:  Patient has no known allergies.   Assessment / Plan:     Visit Diagnoses: Seronegative rheumatoid arthritis (HCC) - Plan: Sedimentation rate  High risk medication use - azathioprine 100 mg daily.Avoiding methotrexate or leflunomide due to history of prior hepatitis. - Plan: CBC with Differential/Platelet, COMPLETE METABOLIC PANEL WITH GFR  Long term (current) use of systemic steroids - prednisone 10 mg daily.  Bilateral foot pain  ***  Orders: Orders Placed This Encounter  Procedures   Sedimentation rate   CBC with Differential/Platelet   COMPLETE METABOLIC PANEL WITH GFR   No orders of the defined types were placed in this encounter.    Follow-Up Instructions: Return in about 3 months (around 12/31/2022) for RA on AZA f/u 3mos.   Fuller Plan, MD  Note - This record has been created using [employer].  Chart creation errors have been sought, but may not always  have been located. Such creation errors do not reflect on  the standard of medical care.

## 2022-10-01 LAB — CBC WITH DIFFERENTIAL/PLATELET
Absolute Monocytes: 360 {cells}/uL (ref 200–950)
Basophils Absolute: 18 {cells}/uL (ref 0–200)
Basophils Relative: 0.3 %
Eosinophils Absolute: 201 {cells}/uL (ref 15–500)
Eosinophils Relative: 3.3 %
HCT: 36.5 % — ABNORMAL LOW (ref 38.5–50.0)
Hemoglobin: 12.1 g/dL — ABNORMAL LOW (ref 13.2–17.1)
Lymphs Abs: 2983 cells/uL (ref 850–3900)
MCH: 28.9 pg (ref 27.0–33.0)
MCHC: 33.2 g/dL (ref 32.0–36.0)
MCV: 87.1 fL (ref 80.0–100.0)
MPV: 9.7 fL (ref 7.5–12.5)
Monocytes Relative: 5.9 %
Neutro Abs: 2538 {cells}/uL (ref 1500–7800)
Neutrophils Relative %: 41.6 %
Platelets: 184 10*3/uL (ref 140–400)
RBC: 4.19 10*6/uL — ABNORMAL LOW (ref 4.20–5.80)
RDW: 12.6 % (ref 11.0–15.0)
Total Lymphocyte: 48.9 %
WBC: 6.1 10*3/uL (ref 3.8–10.8)

## 2022-10-01 LAB — COMPLETE METABOLIC PANEL WITH GFR
AG Ratio: 1.3 (calc) (ref 1.0–2.5)
ALT: 8 U/L — ABNORMAL LOW (ref 9–46)
AST: 15 U/L (ref 10–35)
Albumin: 4 g/dL (ref 3.6–5.1)
Alkaline phosphatase (APISO): 71 U/L (ref 35–144)
BUN/Creatinine Ratio: 26 (calc) — ABNORMAL HIGH (ref 6–22)
BUN: 15 mg/dL (ref 7–25)
CO2: 26 mmol/L (ref 20–32)
Calcium: 9.1 mg/dL (ref 8.6–10.3)
Chloride: 106 mmol/L (ref 98–110)
Creat: 0.57 mg/dL — ABNORMAL LOW (ref 0.70–1.30)
Globulin: 3.2 g/dL (ref 1.9–3.7)
Glucose, Bld: 102 mg/dL — ABNORMAL HIGH (ref 65–99)
Potassium: 4.1 mmol/L (ref 3.5–5.3)
Sodium: 140 mmol/L (ref 135–146)
Total Bilirubin: 0.3 mg/dL (ref 0.2–1.2)
Total Protein: 7.2 g/dL (ref 6.1–8.1)
eGFR: 117 mL/min/{1.73_m2} (ref >=60)

## 2022-10-01 LAB — SEDIMENTATION RATE: Sed Rate: 48 mm/h — ABNORMAL HIGH (ref 0–20)

## 2022-11-12 ENCOUNTER — Other Ambulatory Visit: Payer: Self-pay | Admitting: Internal Medicine

## 2022-11-12 DIAGNOSIS — M0600 Rheumatoid arthritis without rheumatoid factor, unspecified site: Secondary | ICD-10-CM

## 2022-12-16 NOTE — Progress Notes (Deleted)
Office Visit Note  Patient: Michael West             Date of Birth: 1970/12/06           MRN: [id number]             PCP: Patient, No Pcp Per Referring: No ref. provider found Visit Date: 12/30/2022   Subjective:  No chief complaint on file.   History of Present Illness: Michael West is a 52 y.o. 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    Previous HPI 09/30/2022 Michael West is a 52 y.o. 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 He has been taking the medicine only on some days since he has no significant swelling and pain on others. He stopped taking hydroxychloroquine and prednisone without any exacerbation of the symptoms. Has not gotten sick and no intolerance to the mediation thus far.     Previous HPI 08/05/22 Michael West is a 52 y.o. 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  So far addition of hydroxychloroquine he has not seen a significant improvement.  He still has persistent swelling in his hands and his feet.  He is very stiff and has cramping in the morning for at least 20 minutes.  Also gets pain on the bottom of his feet especially near the toes with burning stinging sensation.  He has some limitation at work frequently has to go slower than normal because of his joint stiffness and gets increased pain so impacting his productivity.  Did not notice any trouble taking the medication.   Encounter was conducted with assistance of [place] phone interpreter line.   Previous HPI 05/31/22 Michael West is a 52 y.o. male here for evaluation of inflammatory joint pain after onset last year with several months of shoulder, wrist, and ankle pains.  Symptoms have been problematic since last year and he was evaluated in PCP office in August for this had already been ongoing for several months.  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  He had highly elevated sedimentation rate and CRP and initial treatment with Depo-Medrol injection and a steroid taper.  Since then symptoms have been ongoing he gets pretty good relief while on steroids but whenever he finishes the prescribed medication or skips taking them symptoms quickly get worse again.  Most recently taking 20 mg daily as needed which is effective.  Besides this he takes Advil which is partially effective.  He has some pain with decreased range of motion mostly in the right shoulder and both hands unable to fully tighten his grip.  But his other most problematic symptom is sharp or burning pain in his feet and ankles.  This is worse whenever he is standing or walking for prolonged time and improves when he can take weight off of his feet.  Not associated with as much visible swelling.  Also occasionally gets a sensation like his legs are very weak or locked up and has difficulty moving for up to 2 hours at a time. He has a past history of autoimmune hepatitis which was treated by Dr. Leone Payor with azathioprine after diagnosis in 2013 but off maintenance treatment since 2017 due to disease remission.  There was concern for associated arthralgias apparently referred to Dr.  Truslow for symptoms at the time.  Mr. Sohmer does not recall some of the specifics from that time.   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/31/2022   Bilateral foot pain 05/31/2022    Abdominal wall pain in both upper quadrants 08/10/2015   Thoracic spine pain 08/10/2015   Chronic pain of left ankle 08/10/2015   Vitamin D deficiency 03/30/2012   Long-term use of immunosuppressant medication - azathioprine 02/17/2012   Autoimmune hepatitis (HCC) 08/25/2011    Past Medical History:  Diagnosis Date   Autoimmune hepatitis (HCC)    Liver infection    Unspecified vitamin D deficiency 03/30/2012   .03/2012 - Level is 14    Family History  Problem Relation Age of Onset   Colon cancer Mother    Past Surgical History:  Procedure Laterality Date   dental prosthesis     HEMORRHOID SURGERY  12/06/2000   PERCUTANEOUS LIVER BIOPSY     PROCTOSCOPY  12/06/2000   Internal and external hemorrhoidectomy   Social History   Social History Narrative   Moved to the [place] from [place] in 11/1996.   Married   Immunization History  Administered Date(s) Administered   Influenza Split 11/01/2011   Influenza,inj,Quad PF,6+ Mos 12/06/2012   Influenza-Unspecified 12/23/2014   Pneumococcal Conjugate-13 08/10/2015   Pneumococcal Polysaccharide-23 09/07/2011   Tdap 09/07/2011     Objective: Vital Signs: There were no vitals taken for this visit.   Physical Exam   Musculoskeletal Exam: ***  CDAI Exam: CDAI Score: -- Patient Global: --; Provider Global: -- Swollen: --; Tender: -- Joint Exam 12/30/2022   No joint exam has been documented for this visit   There is currently no information documented on the homunculus. Go to the Rheumatology activity and complete the homunculus joint exam.  Investigation: No additional findings.  Imaging: No results found.  Recent Labs: Lab Results  Component Value Date   WBC 6.1 09/30/2022   HGB 12.1 (L) 09/30/2022   PLT 184 09/30/2022   NA 140 09/30/2022   K 4.1 09/30/2022   CL 106 09/30/2022   CO2 26 09/30/2022   GLUCOSE 102 (H) 09/30/2022   BUN 15 09/30/2022   CREATININE 0.57 (L) 09/30/2022   BILITOT 0.3 09/30/2022    ALKPHOS 82 08/10/2015   ALKPHOS 82 08/10/2015   AST 15 09/30/2022   ALT 8 (L) 09/30/2022   PROT 7.2 09/30/2022   ALBUMIN 4.5 08/10/2015   ALBUMIN 4.5 08/10/2015   CALCIUM 9.1 09/30/2022   GFRAA >90 08/31/2011   QFTBGOLDPLUS NEGATIVE 05/31/2022    Speciality Comments: No specialty comments available.  Procedures:  No procedures performed Allergies: Patient has no known allergies.   Assessment / Plan:     Visit Diagnoses: No diagnosis found.  ***  Orders: No orders of the defined types were placed in this encounter.  No orders of the defined types were placed in this encounter.    Follow-Up Instructions: No follow-ups on file.   Metta Clines, RT  Note - This record has been created using [employer].  Chart creation errors have been sought, but may not  always  have been located. Such creation errors do not reflect on  the standard of medical care.

## 2022-12-30 ENCOUNTER — Ambulatory Visit: Payer: 59 | Admitting: Internal Medicine

## 2022-12-30 DIAGNOSIS — M0600 Rheumatoid arthritis without rheumatoid factor, unspecified site: Secondary | ICD-10-CM

## 2022-12-30 DIAGNOSIS — Z79899 Other long term (current) drug therapy: Secondary | ICD-10-CM

## 2022-12-30 DIAGNOSIS — M79671 Pain in right foot: Secondary | ICD-10-CM
